# Patient Record
Sex: Female | Born: 1995 | Race: Black or African American | Hispanic: No | Marital: Single | Smoking: Never smoker
Health system: Southern US, Community
[De-identification: ages and names within clinical notes are randomized; demographics above are authoritative.]

## PROBLEM LIST (undated history)

## (undated) ENCOUNTER — Inpatient Hospital Stay (HOSPITAL_COMMUNITY): Payer: Self-pay

## (undated) DIAGNOSIS — Z789 Other specified health status: Secondary | ICD-10-CM

## (undated) DIAGNOSIS — J45909 Unspecified asthma, uncomplicated: Secondary | ICD-10-CM

## (undated) DIAGNOSIS — J302 Other seasonal allergic rhinitis: Secondary | ICD-10-CM

## (undated) DIAGNOSIS — O99019 Anemia complicating pregnancy, unspecified trimester: Secondary | ICD-10-CM

## (undated) DIAGNOSIS — O24419 Gestational diabetes mellitus in pregnancy, unspecified control: Secondary | ICD-10-CM

## (undated) HISTORY — DX: Other specified health status: Z78.9

## (undated) HISTORY — PX: KNEE SURGERY: SHX244

## (undated) HISTORY — DX: Gestational diabetes mellitus in pregnancy, unspecified control: O24.419

---
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
## 2017-10-07 ENCOUNTER — Encounter (HOSPITAL_COMMUNITY): Payer: Self-pay | Admitting: Emergency Medicine

## 2017-10-07 ENCOUNTER — Ambulatory Visit (HOSPITAL_COMMUNITY)
Admission: EM | Admit: 2017-10-07 | Discharge: 2017-10-07 | Disposition: A | Discharge status: Home / Self Care | Payer: Medicaid Other | Attending: Family Medicine | Admitting: Family Medicine

## 2017-10-07 DIAGNOSIS — J22 Unspecified acute lower respiratory infection: Secondary | ICD-10-CM

## 2017-10-07 LAB — POCT URINALYSIS DIP (DEVICE)
BILIRUBIN URINE: NEGATIVE
Glucose, UA: NEGATIVE mg/dL
HGB URINE DIPSTICK: NEGATIVE
KETONES UR: NEGATIVE mg/dL
Leukocytes, UA: NEGATIVE
Nitrite: NEGATIVE
PH: 7 (ref 5.0–8.0)
PROTEIN: NEGATIVE mg/dL
Specific Gravity, Urine: 1.02 (ref 1.005–1.030)
Urobilinogen, UA: 1 mg/dL (ref 0.0–1.0)

## 2017-10-07 LAB — POCT PREGNANCY, URINE: PREG TEST UR: NEGATIVE

## 2017-10-07 MED ORDER — AZITHROMYCIN 250 MG PO TABS: ORAL_TABLET | ORAL | 0 refills | Status: DC

## 2017-10-07 MED ORDER — ONDANSETRON HCL 4 MG PO TABS: 4.0000 mg | ORAL_TABLET | Freq: Three times a day (TID) | ORAL | 0 refills | Status: DC | PRN

## 2017-10-07 NOTE — ED Triage Notes (Signed)
PT reports "throwing up mucus and food"  PT reports cough, chest congestion, back pain, wheezing, and fatigue. Fever yesterday per PT.  PT reports symptoms have been going on for 1 week.

## 2017-10-07 NOTE — ED Provider Notes (Signed)
[facility]    CSN: [id number] Arrival date & time: 10/07/17  1210     History   Chief Complaint Chief Complaint  Patient presents with  . Emesis  . URI    HPI Crystal May is a 22 y.o. female.   [name] presents with complaints of productive cough, runny nose, sore throat, bilateral ear congestion, back pain and vomiting of mucus which started 1 week ago, she feels it has worsened. Subjective fever yesterday. States she vomits up mucus approximately 3 times a day. Decreased appetite and nausea present. Denies urinary symptoms. Denies diarrhea or constipation. Did not get a flu vaccine this season. No known ill contacts. Took sinus and allergy medication which did not help so stopped taking. Took motrin last night. Last emesis this morning. LMP 1/21, denies vaginal symptoms. Has not taken any medications today. Mild generalized abdominal pain. History of asthma, otherwise does not take any other medications regularly.    ROS per HPI.       Past Medical History:  Diagnosis Date  . Asthma   . Seasonal allergies     There are no active problems to display for this patient.   History reviewed. No pertinent surgical history.  OB History    No data available       Home Medications    Prior to Admission medications   Medication Sig Start Date End Date Taking? Authorizing Provider  albuterol (PROVENTIL HFA;VENTOLIN HFA) 108 (90 Base) MCG/ACT inhaler Inhale 1-2 puffs into the lungs every 6 (six) hours as needed for wheezing or shortness of breath. 10/22/16  Yes Servando Salinaossi, Catherine H, NP  albuterol (PROVENTIL) (2.5 MG/3ML) 0.083% nebulizer solution Take 3 mLs (2.5 mg total) by nebulization every 6 (six) hours as needed for wheezing or shortness of breath. 10/22/16  Yes Servando Salinaossi, Catherine H, NP  azithromycin (ZITHROMAX) 250 MG tablet Take first 2 tablets together, then 1 every day until finished. 10/07/17   Georgetta HaberBurky, Upton Russey B, NP  ondansetron (ZOFRAN) 4 MG tablet Take 1  tablet (4 mg total) by mouth every 8 (eight) hours as needed for nausea or vomiting. 10/07/17   Georgetta HaberBurky, Marijke Guadiana B, NP    Family History No family history on file.  Social History Social History   Tobacco Use  . Smoking status: Passive Smoke Exposure - Never Smoker  . Smokeless tobacco: Never Used  Substance Use Topics  . Alcohol use: No  . Drug use: No     Allergies   Patient has no known allergies.   Review of Systems Review of Systems   Physical Exam Triage Vital Signs ED Triage Vitals  Enc Vitals Group     BP 10/07/17 1228 117/79     Pulse Rate 10/07/17 1228 85     Resp 10/07/17 1228 18     Temp 10/07/17 1228 98.4 F (36.9 C)     Temp Source 10/07/17 1228 Oral     SpO2 10/07/17 1228 100 %     Weight 10/07/17 1230 150 lb (68 kg)     Height 10/07/17 1230 5\' 2"  (1.575 m)     Head Circumference --      Peak Flow --      Pain Score 10/07/17 1230 7     Pain Loc --      Pain Edu? --      Excl. in GC? --    No data found.  Updated Vital Signs BP 117/79 (BP Location: Left Arm)   Pulse 85  Temp 98.4 F (36.9 C) (Oral)   Resp 18   Ht 5\' 2"  (1.575 m)   Wt 150 lb (68 kg)   LMP 10/01/2017   SpO2 100%   BMI 27.44 kg/m   Visual Acuity Right Eye Distance:   Left Eye Distance:   Bilateral Distance:    Right Eye Near:   Left Eye Near:    Bilateral Near:     Physical Exam  Constitutional: She is oriented to person, place, and time. She appears well-developed and well-nourished. No distress.  HENT:  Head: Normocephalic and atraumatic.  Right Ear: Tympanic membrane, external ear and ear canal normal.  Left Ear: Tympanic membrane, external ear and ear canal normal.  Nose: Nose normal.  Mouth/Throat: Uvula is midline, oropharynx is clear and moist and mucous membranes are normal. No tonsillar exudate.  Eyes: Conjunctivae and EOM are normal. Pupils are equal, round, and reactive to light.  Cardiovascular: Normal rate, regular rhythm and normal heart sounds.    Pulmonary/Chest: Effort normal and breath sounds normal.  Occasional congested cough   Abdominal: There is generalized tenderness.  Lymphadenopathy:    She has no cervical adenopathy.  Neurological: She is alert and oriented to person, place, and time.  Skin: Skin is warm and dry.     UC Treatments / Results  Labs (all labs ordered are listed, but only abnormal results are displayed) Labs Reviewed  POCT URINALYSIS DIP (DEVICE)  POCT PREGNANCY, URINE    EKG  EKG Interpretation None       Radiology No results found.  Procedures Procedures (including critical care time)  Medications Ordered in UC Medications - No data to display   Initial Impression / Assessment and Plan / UC Course  I have reviewed the triage vital signs and the nursing notes.  Pertinent labs & imaging results that were available during my care of the patient were reviewed by me and considered in my medical decision making (see chart for details).     Non specific abdominal findings. Afebrile. Non toxic in appearance, vitals stable. Will try lower respiratory tract infection with azithromycin. zofran as needed. Fluids,  Ibuprofen etc. Return precautions provided. Patient verbalized understanding and agreeable to plan.    Final Clinical Impressions(s) / UC Diagnoses   Final diagnoses:  Lower respiratory tract infection    ED Discharge Orders        Ordered    azithromycin (ZITHROMAX) 250 MG tablet     10/07/17 1259    ondansetron (ZOFRAN) 4 MG tablet  Every 8 hours PRN     10/07/17 1259       Controlled Substance Prescriptions [place] Controlled Substance Registry consulted? Not Applicable   Georgetta Haber, NP 10/07/17 1308

## 2017-10-07 NOTE — Discharge Instructions (Signed)
Push fluids to ensure adequate hydration and keep secretions thin.  Tylenol and/or ibuprofen as needed for pain or fevers.  Complete course of antibiotics as prescribed. Diet as tolerated.  Zofran as needed for nausea. If symptoms worsen or do not improve in the next week to return to be seen or to follow up with PCP.

## 2018-02-19 ENCOUNTER — Ambulatory Visit (HOSPITAL_COMMUNITY)
Admission: EM | Admit: 2018-02-19 | Discharge: 2018-02-19 | Disposition: A | Discharge status: Home / Self Care | Payer: Medicaid Other | Attending: Family Medicine | Admitting: Family Medicine

## 2018-02-19 ENCOUNTER — Other Ambulatory Visit: Payer: Self-pay

## 2018-02-19 ENCOUNTER — Encounter (HOSPITAL_COMMUNITY): Payer: Self-pay | Admitting: Emergency Medicine

## 2018-02-19 DIAGNOSIS — J3089 Other allergic rhinitis: Secondary | ICD-10-CM | POA: Diagnosis not present

## 2018-02-19 MED ORDER — ONDANSETRON 4 MG PO TBDP: 4.0000 mg | ORAL_TABLET | Freq: Three times a day (TID) | ORAL | 0 refills | Status: DC | PRN

## 2018-02-19 MED ORDER — FLUTICASONE PROPIONATE 50 MCG/ACT NA SUSP: 2.0000 | Freq: Every day | NASAL | 0 refills | Status: DC

## 2018-02-19 MED ORDER — ALBUTEROL SULFATE HFA 108 (90 BASE) MCG/ACT IN AERS: 1.0000 | INHALATION_SPRAY | Freq: Four times a day (QID) | RESPIRATORY_TRACT | 0 refills | Status: DC | PRN

## 2018-02-19 MED ORDER — ALBUTEROL SULFATE (2.5 MG/3ML) 0.083% IN NEBU: 2.5000 mg | INHALATION_SOLUTION | Freq: Four times a day (QID) | RESPIRATORY_TRACT | 0 refills | Status: DC | PRN

## 2018-02-19 MED ORDER — CETIRIZINE HCL 10 MG PO TABS: 10.0000 mg | ORAL_TABLET | Freq: Every day | ORAL | 0 refills | Status: DC

## 2018-02-19 MED ORDER — IPRATROPIUM BROMIDE 0.06 % NA SOLN: 2.0000 | Freq: Four times a day (QID) | NASAL | 0 refills | Status: DC

## 2018-02-19 NOTE — Discharge Instructions (Signed)
Zofran as needed for nausea/vomiting. Albuterol as needed. Start flonase, atrovent nasal spray, zyrtec for nasal congestion/drainage. You can use over the counter nasal saline rinse such as neti pot for nasal congestion. Keep hydrated, your urine should be clear to pale yellow in color. Tylenol/motrin for fever and pain. Monitor for any worsening of symptoms, chest pain, shortness of breath, wheezing, swelling of the throat, follow up for reevaluation.   For sore throat try using a honey-based tea. Use 3 teaspoons of honey with juice squeezed from half lemon. Place shaved pieces of ginger into 1/2-1 cup of water and warm over stove top. Then mix the ingredients and repeat every 4 hours as needed.

## 2018-02-19 NOTE — ED Triage Notes (Signed)
Pt reports chest congestion that started one week ago.  She also complains of a scratchy throat and vomiting x2 yesterday.

## 2018-02-19 NOTE — ED Provider Notes (Signed)
[facility]    CSN: [id number] Arrival date & time: 02/19/18  1207     History   Chief Complaint Chief Complaint  Patient presents with  . URI    HPI Crystal May is a 22 y.o. female.   22 year old female comes in with mother for 1 week history of chest congestion, throat itching, rhinorrhea, nasal congestion, cough, sneezing.  States 2 days ago, started having nausea with 2 episodes of nonbilious nonbloody vomit.  Has still been able to eat and drinking without difficulty.  Denies abdominal pain,  diarrhea, constipation.  Denies fever, chills, night sweats.  NyQuil without relief.  Has not had to use her albuterol inhaler, but would like refill just in case.     Past Medical History:  Diagnosis Date  . Asthma   . Seasonal allergies     There are no active problems to display for this patient.   History reviewed. No pertinent surgical history.  OB History   None      Home Medications    Prior to Admission medications   Medication Sig Start Date End Date Taking? Authorizing Provider  albuterol (PROVENTIL HFA;VENTOLIN HFA) 108 (90 Base) MCG/ACT inhaler Inhale 1-2 puffs into the lungs every 6 (six) hours as needed for wheezing or shortness of breath. 02/19/18   Cathie Hoops, Royale Swamy V, PA-C  albuterol (PROVENTIL) (2.5 MG/3ML) 0.083% nebulizer solution Take 3 mLs (2.5 mg total) by nebulization every 6 (six) hours as needed for wheezing or shortness of breath. 02/19/18   Cathie Hoops, Tyra Gural V, PA-C  cetirizine (ZYRTEC) 10 MG tablet Take 1 tablet (10 mg total) by mouth daily. 02/19/18   Cathie Hoops, Chelsey Redondo V, PA-C  fluticasone (FLONASE) 50 MCG/ACT nasal spray Place 2 sprays into both nostrils daily. 02/19/18   Cathie Hoops, Kathlee Barnhardt V, PA-C  ipratropium (ATROVENT) 0.06 % nasal spray Place 2 sprays into both nostrils 4 (four) times daily. 02/19/18   Cathie Hoops, Kiesha Ensey V, PA-C  ondansetron (ZOFRAN ODT) 4 MG disintegrating tablet Take 1 tablet (4 mg total) by mouth every 8 (eight) hours as needed for nausea or vomiting. 02/19/18    Belinda Fisher, PA-C    Family History History reviewed. No pertinent family history.  Social History Social History   Tobacco Use  . Smoking status: Passive Smoke Exposure - Never Smoker  . Smokeless tobacco: Never Used  Substance Use Topics  . Alcohol use: No  . Drug use: No     Allergies   Patient has no known allergies.   Review of Systems Review of Systems  Reason unable to perform ROS: See HPI as above.     Physical Exam Triage Vital Signs ED Triage Vitals [02/19/18 1301]  Enc Vitals Group     BP 126/77     Pulse Rate 74     Resp      Temp 98.2 F (36.8 C)     Temp Source Oral     SpO2 100 %     Weight      Height      Head Circumference      Peak Flow      Pain Score 7     Pain Loc      Pain Edu?      Excl. in GC?    No data found.  Updated Vital Signs BP 126/77 (BP Location: Left Arm)   Pulse 74   Temp 98.2 F (36.8 C) (Oral)   LMP 02/02/2018 (Exact Date)   SpO2 100%  Physical Exam  Constitutional: She is oriented to person, place, and time. She appears well-developed and well-nourished. No distress.  HENT:  Head: Normocephalic and atraumatic.  Right Ear: Tympanic membrane, external ear and ear canal normal. Tympanic membrane is not erythematous and not bulging.  Left Ear: Tympanic membrane, external ear and ear canal normal. Tympanic membrane is not erythematous and not bulging.  Nose: Nose normal. Right sinus exhibits no maxillary sinus tenderness and no frontal sinus tenderness. Left sinus exhibits no maxillary sinus tenderness and no frontal sinus tenderness.  Mouth/Throat: Uvula is midline, oropharynx is clear and moist and mucous membranes are normal.  Eyes: Pupils are equal, round, and reactive to light. Conjunctivae are normal.  Neck: Normal range of motion. Neck supple.  Cardiovascular: Normal rate, regular rhythm and normal heart sounds. Exam reveals no gallop and no friction rub.  No murmur heard. Pulmonary/Chest: Effort normal and  breath sounds normal. She has no decreased breath sounds. She has no wheezes. She has no rhonchi. She has no rales.  Abdominal: Soft. Bowel sounds are normal. There is no tenderness. There is no rebound and no guarding.  Lymphadenopathy:    She has no cervical adenopathy.  Neurological: She is alert and oriented to person, place, and time.  Skin: Skin is warm and dry.  Psychiatric: She has a normal mood and affect. Her behavior is normal. Judgment normal.     UC Treatments / Results  Labs (all labs ordered are listed, but only abnormal results are displayed) Labs Reviewed - No data to display  EKG None  Radiology No results found.  Procedures Procedures (including critical care time)  Medications Ordered in UC Medications - No data to display  Initial Impression / Assessment and Plan / UC Course  I have reviewed the triage vital signs and the nursing notes.  Pertinent labs & imaging results that were available during my care of the patient were reviewed by me and considered in my medical decision making (see chart for details).     Given history and exam, will treat for allergic rhinitis.  Given 2-day history of nausea, vomiting, will provide Zofran as needed.  Albuterol refilled.  Other symptomatic treatment discussed.  Return precautions given.  Patient expresses understanding and agrees to plan.  Final Clinical Impressions(s) / UC Diagnoses   Final diagnoses:  Non-seasonal allergic rhinitis, unspecified trigger    ED Prescriptions    Medication Sig Dispense Auth. Provider   albuterol (PROVENTIL HFA;VENTOLIN HFA) 108 (90 Base) MCG/ACT inhaler Inhale 1-2 puffs into the lungs every 6 (six) hours as needed for wheezing or shortness of breath. 1 Inhaler Jarrah Seher V, PA-C   albuterol (PROVENTIL) (2.5 MG/3ML) 0.083% nebulizer solution Take 3 mLs (2.5 mg total) by nebulization every 6 (six) hours as needed for wheezing or shortness of breath. 75 mL Xian Alves V, PA-C   cetirizine  (ZYRTEC) 10 MG tablet Take 1 tablet (10 mg total) by mouth daily. 15 tablet Treva Huyett V, PA-C   fluticasone (FLONASE) 50 MCG/ACT nasal spray Place 2 sprays into both nostrils daily. 1 g Skiler Tye V, PA-C   ipratropium (ATROVENT) 0.06 % nasal spray Place 2 sprays into both nostrils 4 (four) times daily. 15 mL Gennie Dib V, PA-C   ondansetron (ZOFRAN ODT) 4 MG disintegrating tablet Take 1 tablet (4 mg total) by mouth every 8 (eight) hours as needed for nausea or vomiting. 5 tablet Belinda FisherYu, Maurine Mowbray V, PA-C        Kaylla Cobos V,  PA-C 02/19/18 1336

## 2018-02-23 ENCOUNTER — Ambulatory Visit (INDEPENDENT_AMBULATORY_CARE_PROVIDER_SITE_OTHER): Payer: Medicaid Other

## 2018-02-23 ENCOUNTER — Encounter (HOSPITAL_COMMUNITY): Payer: Self-pay

## 2018-02-23 ENCOUNTER — Ambulatory Visit (HOSPITAL_COMMUNITY)
Admission: EM | Admit: 2018-02-23 | Discharge: 2018-02-23 | Disposition: A | Discharge status: Home / Self Care | Payer: Medicaid Other | Attending: Family Medicine | Admitting: Family Medicine

## 2018-02-23 DIAGNOSIS — M25561 Pain in right knee: Secondary | ICD-10-CM | POA: Diagnosis not present

## 2018-02-23 MED ORDER — GABAPENTIN 300 MG PO CAPS: 300.0000 mg | ORAL_CAPSULE | Freq: Two times a day (BID) | ORAL | 0 refills | Status: DC

## 2018-02-23 NOTE — ED Triage Notes (Signed)
Pt presents with right knee pain and leg tingling. Reports surgery to that knee two years ago.

## 2018-02-23 NOTE — ED Provider Notes (Signed)
[facility]    CSN: [id number] Arrival date & time: 02/23/18  1336     History   Chief Complaint Chief Complaint  Patient presents with  . Knee Pain    HPI Crystal May is a 22 y.o. female.   The history is provided by the patient.  Knee Pain  Location:  Knee Time since incident:  3 days Injury: yes (Surgery on the knee 2 yrs ago for meniscial repair)   Knee location:  R knee Pain details:    Quality:  Aching, cramping and tingling (Feels like the pin inside is moving when she walks)   Radiates to: right foot.   Pain severity now: 6/10  in severity.   Onset quality:  Gradual   Duration:  2 days   Timing:  Constant   Progression:  Worsening Chronicity:  New (New since she had the surgery) Foreign body present: FB, metal/ nail present s/p surgery. Prior injury to area: Meniscial tear. Relieved by:  Nothing Worsened by:  Bearing weight Ineffective treatments:  NSAIDs (Advil didn't work) Associated symptoms: swelling and tingling   Associated symptoms: no back pain, no decreased ROM, no fever, no muscle weakness and no stiffness   Associated symptoms comment:  Numbness in the back of her leg on the calf. Mom stated during surgery 2 yrs ago, they cut the nerve in the front and left the one on her calf. Unclear what this means.Numbness has been since surgery.   Past Medical History:  Diagnosis Date  . Asthma   . Seasonal allergies     There are no active problems to display for this patient.   Past Surgical History:  Procedure Laterality Date  . KNEE SURGERY      OB History   None      Home Medications    Prior to Admission medications   Medication Sig Start Date End Date Taking? Authorizing Provider  albuterol (PROVENTIL HFA;VENTOLIN HFA) 108 (90 Base) MCG/ACT inhaler Inhale 1-2 puffs into the lungs every 6 (six) hours as needed for wheezing or shortness of breath. 02/19/18   Cathie HoopsYu, Amy V, PA-C  albuterol (PROVENTIL) (2.5 MG/3ML) 0.083%  nebulizer solution Take 3 mLs (2.5 mg total) by nebulization every 6 (six) hours as needed for wheezing or shortness of breath. 02/19/18   Cathie HoopsYu, Amy V, PA-C  cetirizine (ZYRTEC) 10 MG tablet Take 1 tablet (10 mg total) by mouth daily. 02/19/18   Cathie HoopsYu, Amy V, PA-C  fluticasone (FLONASE) 50 MCG/ACT nasal spray Place 2 sprays into both nostrils daily. 02/19/18   Cathie HoopsYu, Amy V, PA-C  ipratropium (ATROVENT) 0.06 % nasal spray Place 2 sprays into both nostrils 4 (four) times daily. 02/19/18   Cathie HoopsYu, Amy V, PA-C  ondansetron (ZOFRAN ODT) 4 MG disintegrating tablet Take 1 tablet (4 mg total) by mouth every 8 (eight) hours as needed for nausea or vomiting. 02/19/18   Belinda FisherYu, Amy V, PA-C    Family History Family History  Problem Relation Age of Onset  . Healthy Mother   . Healthy Father     Social History Social History   Tobacco Use  . Smoking status: Passive Smoke Exposure - Never Smoker  . Smokeless tobacco: Never Used  Substance Use Topics  . Alcohol use: No  . Drug use: No     Allergies   Patient has no known allergies.   Review of Systems Review of Systems  Constitutional: Negative for fever.  Respiratory: Negative.   Cardiovascular: Negative.   Gastrointestinal:  Negative.   Musculoskeletal: Positive for joint swelling. Negative for back pain and stiffness.       Right knee pain  Neurological: Positive for numbness.  All other systems reviewed and are negative.    Physical Exam Triage Vital Signs ED Triage Vitals [02/23/18 1420]  Enc Vitals Group     BP 126/73     Pulse Rate 85     Resp 18     Temp 98.4 F (36.9 C)     Temp src      SpO2 95 %     Weight      Height      Head Circumference      Peak Flow      Pain Score 6     Pain Loc      Pain Edu?      Excl. in GC?    No data found.  Updated Vital Signs BP 126/73   Pulse 85   Temp 98.4 F (36.9 C)   Resp 18   LMP 02/02/2018 (Exact Date)   SpO2 95%   Visual Acuity Right Eye Distance:   Left Eye Distance:     Bilateral Distance:    Right Eye Near:   Left Eye Near:    Bilateral Near:     Physical Exam  Constitutional: She appears well-developed. No distress.  Cardiovascular: Normal rate, regular rhythm and normal heart sounds.  No murmur heard. Pulmonary/Chest: Effort normal and breath sounds normal. No respiratory distress. She has no wheezes.  Musculoskeletal:       Right knee: She exhibits decreased range of motion. She exhibits no swelling, no effusion, no deformity, no erythema and no LCL laxity. Tenderness found.       Left knee: Normal.       Legs: Nursing note and vitals reviewed.    UC Treatments / Results  Labs (all labs ordered are listed, but only abnormal results are displayed) Labs Reviewed - No data to display  EKG None  Radiology No results found.  Procedures Procedures (including critical care time)  Medications Ordered in UC Medications - No data to display  Initial Impression / Assessment and Plan / UC Course  I have reviewed the triage vital signs and the nursing notes.  Pertinent labs & imaging results that were available during my care of the patient were reviewed by me and considered in my medical decision making (see chart for details).  Clinical Course as of Feb 23 1529  Sat Feb 23, 2018  1528 Xray report reviewed and discussed with patient. No acute findings. Orthopedic f/u recommended. Gabapentin for tingling and pain. Mom preferred Gabapentin for her over Naproxen. May use Advil for breakthrough pain. F/U as needed.   [KE]    Clinical Course User Index [KE] Doreene Eland, MD    Acute pain of right knee   Final Clinical Impressions(s) / UC Diagnoses   Final diagnoses:  None   Discharge Instructions   None    ED Prescriptions    None     Controlled Substance Prescriptions [place] Controlled Substance Registry consulted? Not Applicable   Doreene Eland, MD 02/23/18 1530

## 2018-02-23 NOTE — Discharge Instructions (Addendum)
It was nice seeing you. Sorry about your knee pain. Xray result is below. No acute finding. Please call to schedule follow-up with orthopedic. Gabapentin for nerve pain. Use Advil as needed for breakthrough pain. Rest at home for today. Follow-up as needed.     IMPRESSION: 1. No acute fracture, dislocation, or joint effusion. Joint spaces are maintained. 2. Postsurgical changes as above.

## 2018-03-10 ENCOUNTER — Other Ambulatory Visit: Payer: Self-pay

## 2018-03-10 ENCOUNTER — Ambulatory Visit (HOSPITAL_COMMUNITY)
Admission: EM | Admit: 2018-03-10 | Discharge: 2018-03-10 | Disposition: A | Discharge status: Home / Self Care | Payer: Medicaid Other | Attending: Family Medicine | Admitting: Family Medicine

## 2018-03-10 DIAGNOSIS — R110 Nausea: Secondary | ICD-10-CM | POA: Diagnosis not present

## 2018-03-10 DIAGNOSIS — Z3201 Encounter for pregnancy test, result positive: Secondary | ICD-10-CM

## 2018-03-10 LAB — POCT PREGNANCY, URINE: PREG TEST UR: POSITIVE — AB

## 2018-03-10 MED ORDER — DOXYLAMINE-PYRIDOXINE 10-10 MG PO TBEC: 10.0000 mg | DELAYED_RELEASE_TABLET | Freq: Every day | ORAL | 0 refills | Status: DC

## 2018-03-10 NOTE — Discharge Instructions (Signed)
Please begin taking prenatal vitamin Please follow-up with [facility] clinic For nausea: Diclegis: Two tablets at bedtime on day 1 and 2; if symptoms persist, take 1 tablet in morning and 2 tablets at bedtime on day 3; if symptoms persist, may increase to 1 tablet in morning, 1 tablet mid-afternoon, and 2 tablets at bedtime on day 4 (maximum: doxylamine 40 mg/pyridoxine 40 mg (4 tablets) per day). OR One-half of the 25 mg Unisom sleep tablet over-the-counter tablet or two chewable 5 mg tablets can be used off-label as an antiemetic. In addition, pyridoxine 25 mg, also available over-the-counter, is taken three or four times per day;This is a reasonable, less expensive substitute for combination tablets.

## 2018-03-10 NOTE — ED Provider Notes (Signed)
[facility]    CSN: [id number] Arrival date & time: 03/10/18  1202     History   Chief Complaint Chief Complaint  Patient presents with  . Possible Pregnancy    HPI Crystal May is a 22 y.o. female history of asthma presenting today for a pregnancy test.  Patient states that she took 2 pregnancy test at home yesterday that were positive.  She states that her last menstrual period was 5/22 to 5/25.  She has had increased nausea, but denies vomiting.  Patient has not been pregnant previously.  HPI  Past Medical History:  Diagnosis Date  . Asthma   . Seasonal allergies     There are no active problems to display for this patient.   Past Surgical History:  Procedure Laterality Date  . KNEE SURGERY      OB History   None      Home Medications    Prior to Admission medications   Medication Sig Start Date End Date Taking? Authorizing Provider  albuterol (PROVENTIL HFA;VENTOLIN HFA) 108 (90 Base) MCG/ACT inhaler Inhale 1-2 puffs into the lungs every 6 (six) hours as needed for wheezing or shortness of breath. 02/19/18   Cathie Hoops, Amy V, PA-C  albuterol (PROVENTIL) (2.5 MG/3ML) 0.083% nebulizer solution Take 3 mLs (2.5 mg total) by nebulization every 6 (six) hours as needed for wheezing or shortness of breath. 02/19/18   Cathie Hoops, Amy V, PA-C  cetirizine (ZYRTEC) 10 MG tablet Take 1 tablet (10 mg total) by mouth daily. 02/19/18   Cathie Hoops, Amy V, PA-C  Doxylamine-Pyridoxine 10-10 MG TBEC Take 10 mg by mouth daily. 03/10/18   Hagen Tidd C, PA-C  fluticasone (FLONASE) 50 MCG/ACT nasal spray Place 2 sprays into both nostrils daily. 02/19/18   Cathie Hoops, Amy V, PA-C  gabapentin (NEURONTIN) 300 MG capsule Take 1 capsule (300 mg total) by mouth 2 (two) times daily for 15 days. 02/23/18 03/10/18  Doreene Eland, MD  ipratropium (ATROVENT) 0.06 % nasal spray Place 2 sprays into both nostrils 4 (four) times daily. 02/19/18   Cathie Hoops, Amy V, PA-C  ondansetron (ZOFRAN ODT) 4 MG disintegrating tablet  Take 1 tablet (4 mg total) by mouth every 8 (eight) hours as needed for nausea or vomiting. 02/19/18   Belinda Fisher, PA-C    Family History Family History  Problem Relation Age of Onset  . Healthy Mother   . Healthy Father     Social History Social History   Tobacco Use  . Smoking status: Passive Smoke Exposure - Never Smoker  . Smokeless tobacco: Never Used  Substance Use Topics  . Alcohol use: No  . Drug use: No     Allergies   Patient has no known allergies.   Review of Systems Review of Systems  Constitutional: Negative for fatigue and fever.  Respiratory: Negative for shortness of breath.   Cardiovascular: Negative for chest pain.  Gastrointestinal: Positive for diarrhea and nausea. Negative for abdominal pain, blood in stool and vomiting.  Genitourinary: Negative for dysuria, frequency, genital sores, pelvic pain, vaginal bleeding, vaginal discharge and vaginal pain.  Neurological: Negative for dizziness, light-headedness and headaches.     Physical Exam Triage Vital Signs ED Triage Vitals  Enc Vitals Group     BP 03/10/18 1231 117/72     Pulse Rate 03/10/18 1231 96     Resp --      Temp 03/10/18 1231 98.5 F (36.9 C)     Temp Source 03/10/18 1231 Oral  SpO2 03/10/18 1231 100 %     Weight --      Height --      Head Circumference --      Peak Flow --      Pain Score 03/10/18 1230 0     Pain Loc --      Pain Edu? --      Excl. in GC? --    No data found.  Updated Vital Signs BP 117/72 (BP Location: Left Arm)   Pulse 96   Temp 98.5 F (36.9 C) (Oral)   LMP 01/30/2018 (Exact Date)   SpO2 100%   Visual Acuity Right Eye Distance:   Left Eye Distance:   Bilateral Distance:    Right Eye Near:   Left Eye Near:    Bilateral Near:     Physical Exam  Constitutional: She is oriented to person, place, and time. She appears well-developed and well-nourished.  No acute distress  HENT:  Head: Normocephalic and atraumatic.  Nose: Nose normal.    Eyes: Conjunctivae are normal.  Neck: Neck supple.  Cardiovascular: Normal rate.  Pulmonary/Chest: Effort normal. No respiratory distress.  Abdominal: Soft. She exhibits no distension. There is no tenderness.  Musculoskeletal: Normal range of motion.  Neurological: She is alert and oriented to person, place, and time.  Skin: Skin is warm and dry.  Psychiatric: She has a normal mood and affect.  Nursing note and vitals reviewed.    UC Treatments / Results  Labs (all labs ordered are listed, but only abnormal results are displayed) Labs Reviewed  POCT PREGNANCY, URINE - Abnormal; Notable for the following components:      Result Value   Preg Test, Ur POSITIVE (*)    All other components within normal limits    EKG None  Radiology No results found.  Procedures Procedures (including critical care time)  Medications Ordered in UC Medications - No data to display  Initial Impression / Assessment and Plan / UC Course  I have reviewed the triage vital signs and the nursing notes.  Pertinent labs & imaging results that were available during my care of the patient were reviewed by me and considered in my medical decision making (see chart for details).     Pregnancy test positive.  Will have patient follow-up with women's clinic.  Initiate prenatal vitamin.  Provided information on prenatal care.  Discussed using Diclegis for nausea, provided over-the-counter instructions as well.Discussed strict return precautions. Patient verbalized understanding and is agreeable with plan.  Final Clinical Impressions(s) / UC Diagnoses   Final diagnoses:  Positive pregnancy test     Discharge Instructions     Please begin taking prenatal vitamin Please follow-up with women's clinic For nausea: Diclegis: Two tablets at bedtime on day 1 and 2; if symptoms persist, take 1 tablet in morning and 2 tablets at bedtime on day 3; if symptoms persist, may increase to 1 tablet in morning, 1  tablet mid-afternoon, and 2 tablets at bedtime on day 4 (maximum: doxylamine 40 mg/pyridoxine 40 mg (4 tablets) per day). OR One-half of the 25 mg Unisom sleep tablet over-the-counter tablet or two chewable 5 mg tablets can be used off-label as an antiemetic. In addition, pyridoxine 25 mg, also available over-the-counter, is taken three or four times per day;This is a reasonable, less expensive substitute for combination tablets.    ED Prescriptions    Medication Sig Dispense Auth. Provider   Doxylamine-Pyridoxine 10-10 MG TBEC Take 10 mg by mouth daily. 60  tablet Lido Maske, DaleHallie C, PA-C     Controlled Substance Prescriptions [place] Controlled Substance Registry consulted? Not Applicable   Lew DawesWieters, Cross Jorge C, [place]PA-C 03/10/18 1324

## 2018-03-10 NOTE — ED Triage Notes (Signed)
Took pregnancy test and it was positive at home and would like to get confirmation

## 2018-04-13 ENCOUNTER — Encounter (HOSPITAL_COMMUNITY): Payer: Self-pay | Admitting: Emergency Medicine

## 2018-04-13 ENCOUNTER — Emergency Department (HOSPITAL_COMMUNITY): Payer: Self-pay

## 2018-04-13 ENCOUNTER — Other Ambulatory Visit: Payer: Self-pay

## 2018-04-13 ENCOUNTER — Emergency Department (HOSPITAL_COMMUNITY)
Admission: EM | Admit: 2018-04-13 | Discharge: 2018-04-13 | Disposition: A | Discharge status: Home / Self Care | Payer: Self-pay | Attending: Emergency Medicine | Admitting: Emergency Medicine

## 2018-04-13 DIAGNOSIS — J45909 Unspecified asthma, uncomplicated: Secondary | ICD-10-CM | POA: Insufficient documentation

## 2018-04-13 DIAGNOSIS — O200 Threatened abortion: Secondary | ICD-10-CM | POA: Insufficient documentation

## 2018-04-13 DIAGNOSIS — Z3A01 Less than 8 weeks gestation of pregnancy: Secondary | ICD-10-CM | POA: Insufficient documentation

## 2018-04-13 DIAGNOSIS — Z79899 Other long term (current) drug therapy: Secondary | ICD-10-CM | POA: Insufficient documentation

## 2018-04-13 DIAGNOSIS — Z7722 Contact with and (suspected) exposure to environmental tobacco smoke (acute) (chronic): Secondary | ICD-10-CM | POA: Insufficient documentation

## 2018-04-13 LAB — COMPREHENSIVE METABOLIC PANEL
ALBUMIN: 3.7 g/dL (ref 3.5–5.0)
ALT: 15 U/L (ref 0–44)
AST: 16 U/L (ref 15–41)
Alkaline Phosphatase: 49 U/L (ref 38–126)
Anion gap: 8 (ref 5–15)
BUN: 9 mg/dL (ref 6–20)
CALCIUM: 9.4 mg/dL (ref 8.9–10.3)
CHLORIDE: 106 mmol/L (ref 98–111)
CO2: 22 mmol/L (ref 22–32)
CREATININE: 0.59 mg/dL (ref 0.44–1.00)
GFR calc Af Amer: >60 mL/min (ref >60)
Glucose, Bld: 107 mg/dL — ABNORMAL HIGH (ref 70–99)
Potassium: 3.9 mmol/L (ref 3.5–5.1)
SODIUM: 136 mmol/L (ref 135–145)
TOTAL PROTEIN: 6.9 g/dL (ref 6.5–8.1)
Total Bilirubin: 0.3 mg/dL (ref 0.3–1.2)

## 2018-04-13 LAB — URINALYSIS, ROUTINE W REFLEX MICROSCOPIC
BILIRUBIN URINE: NEGATIVE
Glucose, UA: NEGATIVE mg/dL
Ketones, ur: NEGATIVE mg/dL
Leukocytes, UA: NEGATIVE
NITRITE: NEGATIVE
Protein, ur: NEGATIVE mg/dL
SPECIFIC GRAVITY, URINE: 1.015 (ref 1.005–1.030)
pH: 7 (ref 5.0–8.0)

## 2018-04-13 LAB — CBC WITH DIFFERENTIAL/PLATELET
ABS IMMATURE GRANULOCYTES: 0 10*3/uL (ref 0.0–0.1)
BASOS ABS: 0 10*3/uL (ref 0.0–0.1)
BASOS PCT: 0 %
EOS ABS: 0.3 10*3/uL (ref 0.0–0.7)
Eosinophils Relative: 3 %
HCT: 37.9 % (ref 36.0–46.0)
Hemoglobin: 12 g/dL (ref 12.0–15.0)
Immature Granulocytes: 0 %
Lymphocytes Relative: 36 %
Lymphs Abs: 3.1 10*3/uL (ref 0.7–4.0)
MCH: 25.4 pg — ABNORMAL LOW (ref 26.0–34.0)
MCHC: 31.7 g/dL (ref 30.0–36.0)
MCV: 80.3 fL (ref 78.0–100.0)
MONOS PCT: 5 %
Monocytes Absolute: 0.4 10*3/uL (ref 0.1–1.0)
NEUTROS ABS: 4.8 10*3/uL (ref 1.7–7.7)
NEUTROS PCT: 56 %
Platelets: 253 10*3/uL (ref 150–400)
RBC: 4.72 MIL/uL (ref 3.87–5.11)
RDW: 14.1 % (ref 11.5–15.5)
WBC: 8.6 10*3/uL (ref 4.0–10.5)

## 2018-04-13 LAB — HCG, QUANTITATIVE, PREGNANCY: hCG, Beta Chain, Quant, S: 29878 m[IU]/mL — ABNORMAL HIGH (ref <5)

## 2018-04-13 LAB — WET PREP, GENITAL
SPERM: NONE SEEN
TRICH WET PREP: NONE SEEN
WBC, Wet Prep HPF POC: NONE SEEN
YEAST WET PREP: NONE SEEN

## 2018-04-13 LAB — ABO/RH
ABO/RH(D): O NEG
Antibody Screen: NEGATIVE

## 2018-04-13 NOTE — Discharge Instructions (Addendum)
Your exam today is concerning for possible miscarriage. [facility]  [place] Ob Comp < 14 Wks  Result Date: 04/13/2018 CLINICAL DATA:  Pregnant patient in first-trimester pregnancy with vaginal bleeding. EXAM: OBSTETRIC <14 WK US AND TRANSVAGINAL OB US TECHNIQUE: Both transabdominal and transvaginal ultrasound examinations were performed for complete evaluation of the gestation as well as the maternal uterus, adnexal regions, and pelvic cul-de-sac. Transvaginal technique was performed to assess early pregnancy. COMPARISON:  None. FINDINGS: Intrauterine gestational sac: Single Yolk sac:  Visualized. Embryo:  Visualized. Cardiac Activity: Not Visualized. Heart Rate: Not applicable. CRL:  3.4 mm   6 w   0 d                  US EDC: 12/07/2018 Subchorionic hemorrhage:  None visualized. Maternal uterus/adnexae: Both ovaries are visualized and are normal. No adnexal mass. No pelvic free fluid. IMPRESSION: Intrauterine gestational sac containing a yolk sac and fetal pole but no cardiac activity demonstrated. Findings are suspicious but not yet definitive for failed pregnancy. Recommend follow-up US in 10-14 days for definitive diagnosis. This recommendation follows SRU consensus guidelines: Diagnostic Criteria for Nonviable Pregnancy Early in the First Trimester. Malva Limes Engl J Med 2013; 696:2952-84; 369:1443-51. Electronically Signed   By: Rubye OaksMelanie  Ehinger M.D.   On: 04/13/2018 06:10   [place] Ob Transvaginal  Result Date: 04/13/2018 CLINICAL DATA:  Pregnant patient in first-trimester pregnancy with vaginal bleeding. EXAM: OBSTETRIC <14 WK US AND TRANSVAGINAL OB US TECHNIQUE: Both transabdominal and transvaginal ultrasound examinations were performed for complete evaluation of the gestation as well as the maternal uterus, adnexal regions, and pelvic cul-de-sac. Transvaginal technique was performed to assess early pregnancy. COMPARISON:  None.  FINDINGS: Intrauterine gestational sac: Single Yolk sac:  Visualized. Embryo:  Visualized. Cardiac Activity: Not Visualized. Heart Rate: Not applicable. CRL:  3.4 mm   6 w   0 d                  US EDC: 12/07/2018 Subchorionic hemorrhage:  None visualized. Maternal uterus/adnexae: Both ovaries are visualized and are normal. No adnexal mass. No pelvic free fluid. IMPRESSION: Intrauterine gestational sac containing a yolk sac and fetal pole but no cardiac activity demonstrated. Findings are suspicious but not yet definitive for failed pregnancy. Recommend follow-up US in 10-14 days for definitive diagnosis. This recommendation follows SRU consensus guidelines: Diagnostic Criteria for Nonviable Pregnancy Early in the First Trimester. Malva Limes Engl J Med 2013; 132:4401-02; 369:1443-51. Electronically Signed   By: Rubye OaksMelanie  Ehinger M.D.   On: 04/13/2018 06:10

## 2018-04-13 NOTE — ED Provider Notes (Signed)
[facility] EMERGENCY DEPARTMENT Provider Note   CSN: [id number] Arrival date & time: 04/13/18  0401     History   Chief Complaint Chief Complaint  Patient presents with  . Vaginal Bleeding    pregnant    HPI Crystal May is a 21 y.o. female.  HPI  22 year old female G1, P0 LMP 518 presents today stating she has had some vaginal bleeding and cramping during the night last night.  She describes 2 episodes of dark-colored blood on the toilet tissue.  She did not have any ongoing abdominal pain.  She is currently not having pain or bleeding.  She denies any abnormal vaginal discharge.  She states that this is her first pregnancy.  She feels that she has been having some signs and symptoms consistent with pregnancy.  She had a positive pregnancy test at home in June and then was seen in urgent care and states she had positive patency test at that time.  Past Medical History:  Diagnosis Date  . Asthma   . Seasonal allergies     There are no active problems to display for this patient.   Past Surgical History:  Procedure Laterality Date  . KNEE SURGERY       OB History    Gravida  1   Para      Term      Preterm      AB      Living        SAB      TAB      Ectopic      Multiple      Live Births               Home Medications    Prior to Admission medications   Medication Sig Start Date End Date Taking? Authorizing Provider  albuterol (PROVENTIL HFA;VENTOLIN HFA) 108 (90 Base) MCG/ACT inhaler Inhale 1-2 puffs into the lungs every 6 (six) hours as needed for wheezing or shortness of breath. 02/19/18   Cathie Hoops, Amy V, PA-C  albuterol (PROVENTIL) (2.5 MG/3ML) 0.083% nebulizer solution Take 3 mLs (2.5 mg total) by nebulization every 6 (six) hours as needed for wheezing or shortness of breath. 02/19/18   Cathie Hoops, Amy V, PA-C  cetirizine (ZYRTEC) 10 MG tablet Take 1 tablet (10 mg total) by mouth daily. 02/19/18   Cathie Hoops, Amy V, PA-C    Doxylamine-Pyridoxine 10-10 MG TBEC Take 10 mg by mouth daily. 03/10/18   Wieters, Hallie C, PA-C  fluticasone (FLONASE) 50 MCG/ACT nasal spray Place 2 sprays into both nostrils daily. 02/19/18   Cathie Hoops, Amy V, PA-C  gabapentin (NEURONTIN) 300 MG capsule Take 1 capsule (300 mg total) by mouth 2 (two) times daily for 15 days. 02/23/18 03/10/18  Doreene Eland, MD  ipratropium (ATROVENT) 0.06 % nasal spray Place 2 sprays into both nostrils 4 (four) times daily. 02/19/18   Cathie Hoops, Amy V, PA-C  ondansetron (ZOFRAN ODT) 4 MG disintegrating tablet Take 1 tablet (4 mg total) by mouth every 8 (eight) hours as needed for nausea or vomiting. 02/19/18   Belinda Fisher, PA-C    Family History Family History  Problem Relation Age of Onset  . Healthy Mother   . Healthy Father     Social History Social History   Tobacco Use  . Smoking status: Passive Smoke Exposure - Never Smoker  . Smokeless tobacco: Never Used  Substance Use Topics  . Alcohol use: No  . Drug use: No  Allergies   Patient has no known allergies.   Review of Systems Review of Systems  All other systems reviewed and are negative.    Physical Exam Updated Vital Signs BP 113/74 (BP Location: Right Arm)   Pulse 87   Temp 98.9 F (37.2 C) (Oral)   Resp 12   Ht 1.575 m (5\' 2" )   Wt 72.6 kg (160 lb)   LMP 01/30/2018   SpO2 99%   BMI 29.26 kg/m   Physical Exam  Constitutional: She is oriented to person, place, and time. She appears well-developed and well-nourished.  HENT:  Head: Normocephalic and atraumatic.  Right Ear: External ear normal.  Left Ear: External ear normal.  Nose: Nose normal.  Mouth/Throat: Oropharynx is clear and moist.  Eyes: Pupils are equal, round, and reactive to light. Conjunctivae and EOM are normal.  Neck: Normal range of motion. Neck supple.  Cardiovascular: Normal rate, regular rhythm, normal heart sounds and intact distal pulses.  Pulmonary/Chest: Effort normal and breath sounds normal.   Abdominal: Soft. Bowel sounds are normal.  Genitourinary: Vagina normal.  Genitourinary Comments: Uterus palpable and slightly enlarged  Musculoskeletal: Normal range of motion.  Neurological: She is alert and oriented to person, place, and time. She has normal reflexes.  Skin: Skin is warm and dry.  Psychiatric: She has a normal mood and affect. Her behavior is normal. Judgment and thought content normal.  Nursing note and vitals reviewed.    ED Treatments / Results  Labs (all labs ordered are listed, but only abnormal results are displayed) Labs Reviewed  CBC WITH DIFFERENTIAL/PLATELET - Abnormal; Notable for the following components:      Result Value   MCH 25.4 (*)    All other components within normal limits  COMPREHENSIVE METABOLIC PANEL - Abnormal; Notable for the following components:   Glucose, Bld 107 (*)    All other components within normal limits  HCG, QUANTITATIVE, PREGNANCY - Abnormal; Notable for the following components:   hCG, Beta Chain, Quant, S 29,878 (*)    All other components within normal limits  URINALYSIS, ROUTINE W REFLEX MICROSCOPIC - Abnormal; Notable for the following components:   Color, Urine STRAW (*)    Hgb urine dipstick MODERATE (*)    Bacteria, UA RARE (*)    All other components within normal limits  WET PREP, GENITAL  ABO/RH  GC/CHLAMYDIA PROBE AMP ([place]) NOT AT [facility]    EKG None  Radiology [place] Ob Comp < 14 Wks  Result Date: 04/13/2018 CLINICAL DATA:  Pregnant patient in first-trimester pregnancy with vaginal bleeding. EXAM: OBSTETRIC <14 WK US AND TRANSVAGINAL OB US TECHNIQUE: Both transabdominal and transvaginal ultrasound examinations were performed for complete evaluation of the gestation as well as the maternal uterus, adnexal regions, and pelvic cul-de-sac. Transvaginal technique was performed to assess early pregnancy. COMPARISON:  None. FINDINGS: Intrauterine gestational sac: Single Yolk sac:  Visualized. Embryo:   Visualized. Cardiac Activity: Not Visualized. Heart Rate: Not applicable. CRL:  3.4 mm   6 w   0 d                  US EDC: 12/07/2018 Subchorionic hemorrhage:  None visualized. Maternal uterus/adnexae: Both ovaries are visualized and are normal. No adnexal mass. No pelvic free fluid. IMPRESSION: Intrauterine gestational sac containing a yolk sac and fetal pole but no cardiac activity demonstrated. Findings are suspicious but not yet definitive for failed pregnancy. Recommend follow-up US in 10-14 days for definitive diagnosis. This recommendation follows  SRU consensus guidelines: Diagnostic Criteria for Nonviable Pregnancy Early in the First Trimester. Malva Limes Med 2013; 161:0960-45. Electronically Signed   By: Rubye Oaks M.D.   On: 04/13/2018 06:10   US Ob Transvaginal  Result Date: 04/13/2018 CLINICAL DATA:  Pregnant patient in first-trimester pregnancy with vaginal bleeding. EXAM: OBSTETRIC <14 WK [place] AND TRANSVAGINAL OB US TECHNIQUE: Both transabdominal and transvaginal ultrasound examinations were performed for complete evaluation of the gestation as well as the maternal uterus, adnexal regions, and pelvic cul-de-sac. Transvaginal technique was performed to assess early pregnancy. COMPARISON:  None. FINDINGS: Intrauterine gestational sac: Single Yolk sac:  Visualized. Embryo:  Visualized. Cardiac Activity: Not Visualized. Heart Rate: Not applicable. CRL:  3.4 mm   6 w   0 d                  [place] EDC: 12/07/2018 Subchorionic hemorrhage:  None visualized. Maternal uterus/adnexae: Both ovaries are visualized and are normal. No adnexal mass. No pelvic free fluid. IMPRESSION: Intrauterine gestational sac containing a yolk sac and fetal pole but no cardiac activity demonstrated. Findings are suspicious but not yet definitive for failed pregnancy. Recommend follow-up US in 10-14 days for definitive diagnosis. This recommendation follows SRU consensus guidelines: Diagnostic Criteria for Nonviable Pregnancy Early  in the First Trimester. Malva Limes Med 2013; 409:8119-14. Electronically Signed   By: Rubye Oaks M.D.   On: 04/13/2018 06:10    Procedures Procedures (including critical care time)  Medications Ordered in ED Medications - No data to display   Initial Impression / Assessment and Plan / ED Course  I have reviewed the triage vital signs and the nursing notes.  Pertinent labs & imaging results that were available during my care of the patient were reviewed by me and considered in my medical decision making (see chart for details).    22 year old female presents today with vaginal bleeding and some cramping.  Ultrasound done here today reveals intrauterine gestational sac containing yolk sac and fetal pole but no cardiac activity is demonstrated findings are suspicious but not defining for failed pregnancy.  Plan follow-up ultrasound in 10 to 14 days for definite diagnosis.  I discussed the results the patient.  She is advised to follow-up at [facility] in 1 to 2 days.  We discussed other return precautions and need for follow-up and she voices understanding. Patient Rh-  Final Clinical Impressions(s) / ED Diagnoses   Final diagnoses:  Threatened abortion in first trimester    ED Discharge Orders    None       Margarita Grizzle, MD 04/13/18 0930

## 2018-04-13 NOTE — ED Notes (Signed)
Dahlia ClientHannah, GeorgiaPA notified of pt and orders placed.

## 2018-04-13 NOTE — ED Triage Notes (Addendum)
C/o vaginal bleeding x 1 hour.  Pt states she is [redacted] weeks pregnant.  Denies abd pain.  First US scheduled for Tuesday.

## 2018-04-15 LAB — GC/CHLAMYDIA PROBE AMP (~~LOC~~) NOT AT ARMC
CHLAMYDIA, DNA PROBE: NEGATIVE
Neisseria Gonorrhea: NEGATIVE

## 2018-04-16 ENCOUNTER — Ambulatory Visit: Payer: Self-pay | Admitting: Student

## 2018-04-16 DIAGNOSIS — O3680X0 Pregnancy with inconclusive fetal viability, not applicable or unspecified: Secondary | ICD-10-CM

## 2018-04-16 NOTE — Progress Notes (Signed)
Patient arrived for NOB visit this am. Denies pain or bleeding today. Given that patient newly diagnosed with threatened miscarriage on 8-3, consult with Dr. Macon LargeAnyanwu as to whether or not to proceed with NOB visit or have US today. Dr. Macon LargeAnyanwu recommends that patient have follow-up US next week and [facility] if pregnancy is [facility]. Patient verbalized understanding and is amenable to plan of care.   Luna KitchensKathryn Presleigh Feldstein CNM

## 2018-04-16 NOTE — BH Specialist Note (Unsigned)
Integrated Behavioral Health Initial Visit  MRN: [id number] Name: Crystal May  Number of Integrated Behavioral Health Clinician visits:: 1/6 Session Start time: ***  Session End time: *** Total time: {IBH Total Time:21014050}  Type of Service: Integrated Behavioral Health- Individual/Family Interpretor:No. Interpretor Name and Language: n/a   Warm Hand Off Completed.       SUBJECTIVE: Crystal May is a 22 y.o. female accompanied by {CHL AMB ACCOMPANIED WU:[phone]} Patient was referred by Luna KitchensKathryn Kooistra, CNM for initial OB introduction to integrated behavioral health services. Patient reports the following symptoms/concerns: *** Duration of problem: ***; Severity of problem: {Mild/Moderate/Severe:20260}  OBJECTIVE: Mood: {BHH MOOD:22306} and Affect: {BHH AFFECT:22307} Risk of harm to self or others: {CHL AMB BH Suicide Current Mental Status:21022748}  LIFE CONTEXT: Family and Social: *** School/Work: *** Self-Care: *** Life Changes: Current pregnancy ***  GOALS ADDRESSED: Patient will: 1. Reduce symptoms of: {IBH Symptoms:21014056} 2. Increase knowledge and/or ability of: {IBH Patient Tools:21014057}  3. Demonstrate ability to: {IBH Goals:21014053}  INTERVENTIONS: Interventions utilized: {IBH Interventions:21014054}  Standardized Assessments completed: GAD-7 and PHQ 9  ASSESSMENT: Patient currently experiencing Supervision of *** pregnancy ***.   Patient may benefit from initial OB introduction to integrated behavioral health services ***.  PLAN: 1. Follow up with behavioral health clinician on : *** 2. Behavioral recommendations:  -*** 3. Referral(s): {IBH Referrals:21014055} 4. "From scale of 1-10, how likely are you to follow plan?": ***  Valetta CloseJamie C McMannes, LCSW

## 2018-04-17 ENCOUNTER — Ambulatory Visit (INDEPENDENT_AMBULATORY_CARE_PROVIDER_SITE_OTHER): Payer: Self-pay | Admitting: Physician Assistant

## 2018-04-21 ENCOUNTER — Inpatient Hospital Stay (HOSPITAL_COMMUNITY)
Admission: AD | Admit: 2018-04-21 | Discharge: 2018-04-21 | Disposition: A | Discharge status: Home / Self Care | Payer: Self-pay | Attending: Obstetrics and Gynecology | Admitting: Obstetrics and Gynecology

## 2018-04-21 ENCOUNTER — Encounter (HOSPITAL_COMMUNITY): Payer: Self-pay | Admitting: *Deleted

## 2018-04-21 ENCOUNTER — Inpatient Hospital Stay (HOSPITAL_COMMUNITY): Payer: Self-pay

## 2018-04-21 ENCOUNTER — Other Ambulatory Visit: Payer: Self-pay

## 2018-04-21 DIAGNOSIS — Z9889 Other specified postprocedural states: Secondary | ICD-10-CM | POA: Insufficient documentation

## 2018-04-21 DIAGNOSIS — O021 Missed abortion: Secondary | ICD-10-CM | POA: Insufficient documentation

## 2018-04-21 DIAGNOSIS — Z79899 Other long term (current) drug therapy: Secondary | ICD-10-CM | POA: Insufficient documentation

## 2018-04-21 DIAGNOSIS — Z7722 Contact with and (suspected) exposure to environmental tobacco smoke (acute) (chronic): Secondary | ICD-10-CM | POA: Insufficient documentation

## 2018-04-21 DIAGNOSIS — Z3A01 Less than 8 weeks gestation of pregnancy: Secondary | ICD-10-CM

## 2018-04-21 DIAGNOSIS — M549 Dorsalgia, unspecified: Secondary | ICD-10-CM | POA: Insufficient documentation

## 2018-04-21 LAB — URINALYSIS, ROUTINE W REFLEX MICROSCOPIC
BILIRUBIN URINE: NEGATIVE
Glucose, UA: NEGATIVE mg/dL
KETONES UR: NEGATIVE mg/dL
NITRITE: NEGATIVE
PH: 8 (ref 5.0–8.0)
Protein, ur: 30 mg/dL — AB
SPECIFIC GRAVITY, URINE: 1.025 (ref 1.005–1.030)

## 2018-04-21 LAB — HCG, QUANTITATIVE, PREGNANCY: HCG, BETA CHAIN, QUANT, S: 9488 m[IU]/mL — AB (ref <5)

## 2018-04-21 LAB — CBC
HCT: 36.7 % (ref 36.0–46.0)
Hemoglobin: 12.1 g/dL (ref 12.0–15.0)
MCH: 25.7 pg — AB (ref 26.0–34.0)
MCHC: 33 g/dL (ref 30.0–36.0)
MCV: 77.9 fL — ABNORMAL LOW (ref 78.0–100.0)
PLATELETS: 219 10*3/uL (ref 150–400)
RBC: 4.71 MIL/uL (ref 3.87–5.11)
RDW: 14.4 % (ref 11.5–15.5)
WBC: 7 10*3/uL (ref 4.0–10.5)

## 2018-04-21 MED ORDER — ONDANSETRON 8 MG PO TBDP: 8.0000 mg | ORAL_TABLET | Freq: Three times a day (TID) | ORAL | 0 refills | Status: DC | PRN

## 2018-04-21 MED ORDER — RHO D IMMUNE GLOBULIN 1500 UNIT/2ML IJ SOSY
300.0000 ug | PREFILLED_SYRINGE | Freq: Once | INTRAMUSCULAR | Status: AC
  Administered 2018-04-21: 300 ug via INTRAMUSCULAR
  Filled 2018-04-21: qty 2

## 2018-04-21 MED ORDER — IBUPROFEN 800 MG PO TABS: 800.0000 mg | ORAL_TABLET | Freq: Three times a day (TID) | ORAL | 0 refills | Status: DC | PRN

## 2018-04-21 MED ORDER — HYDROCODONE-ACETAMINOPHEN 5-325 MG PO TABS: 2.0000 | ORAL_TABLET | ORAL | 0 refills | Status: DC | PRN

## 2018-04-21 MED ORDER — MISOPROSTOL 200 MCG PO TABS: 800.0000 ug | ORAL_TABLET | Freq: Once | ORAL | 1 refills | Status: DC

## 2018-04-21 NOTE — MAU Provider Note (Addendum)
History     CSN: [id number]  Arrival date and time: 04/21/18 1018   None     Chief Complaint  Patient presents with  . Vaginal Bleeding  . Back Pain   Crystal May is a 22 y.o. G1P0 at [redacted]w[redacted]d who presents today with vaginal bleeding. She reports that this started yesterday. She denies any pain.   Vaginal Bleeding  The patient's primary symptoms include pelvic pain and vaginal bleeding. This is a new problem. The current episode started yesterday. The problem occurs intermittently. The problem has been unchanged. The pain is mild. The problem affects both sides. She is pregnant. Pertinent negatives include no chills, dysuria, fever, frequency, nausea or vomiting. The vaginal bleeding is lighter than menses. She has been passing clots. She has not been passing tissue. Nothing aggravates the symptoms. She has tried nothing for the symptoms. She uses nothing for contraception.    OB History    Gravida  1   Para      Term      Preterm      AB      Living        SAB      TAB      Ectopic      Multiple      Live Births              Past Medical History:  Diagnosis Date  . Asthma   . Seasonal allergies     Past Surgical History:  Procedure Laterality Date  . KNEE SURGERY      Family History  Problem Relation Age of Onset  . Healthy Mother   . Healthy Father     Social History   Tobacco Use  . Smoking status: Passive Smoke Exposure - Never Smoker  . Smokeless tobacco: Never Used  Substance Use Topics  . Alcohol use: No  . Drug use: No    Allergies: No Known Allergies  Medications Prior to Admission  Medication Sig Dispense Refill Last Dose  . albuterol (PROVENTIL HFA;VENTOLIN HFA) 108 (90 Base) MCG/ACT inhaler Inhale 1-2 puffs into the lungs every 6 (six) hours as needed for wheezing or shortness of breath. 1 Inhaler 0   . albuterol (PROVENTIL) (2.5 MG/3ML) 0.083% nebulizer solution Take 3 mLs (2.5 mg total) by nebulization every 6 (six) hours  as needed for wheezing or shortness of breath. 75 mL 0   . cetirizine (ZYRTEC) 10 MG tablet Take 1 tablet (10 mg total) by mouth daily. 15 tablet 0   . Doxylamine-Pyridoxine 10-10 MG TBEC Take 10 mg by mouth daily. 60 tablet 0   . fluticasone (FLONASE) 50 MCG/ACT nasal spray Place 2 sprays into both nostrils daily. 1 g 0   . gabapentin (NEURONTIN) 300 MG capsule Take 1 capsule (300 mg total) by mouth 2 (two) times daily for 15 days. 30 capsule 0   . ipratropium (ATROVENT) 0.06 % nasal spray Place 2 sprays into both nostrils 4 (four) times daily. 15 mL 0   . ondansetron (ZOFRAN ODT) 4 MG disintegrating tablet Take 1 tablet (4 mg total) by mouth every 8 (eight) hours as needed for nausea or vomiting. 5 tablet 0     Review of Systems  Constitutional: Negative for chills and fever.  Gastrointestinal: Negative for nausea and vomiting.  Genitourinary: Positive for pelvic pain and vaginal bleeding. Negative for dysuria and frequency.   Physical Exam   Blood pressure 123/70, pulse 97, temperature 98 F (36.7 C), temperature source  Oral, resp. rate 20, height 5\' 2"  (1.575 m), weight 82.4 kg, last menstrual period 02/02/2018, SpO2 100 %.  Physical Exam  Nursing note and vitals reviewed. Constitutional: She is oriented to person, place, and time. She appears well-developed and well-nourished. No distress.  HENT:  Head: Normocephalic.  Cardiovascular: Normal rate.  Respiratory: Effort normal.  GI: Soft. There is no tenderness. There is no rebound.  Neurological: She is alert and oriented to person, place, and time.  Skin: Skin is warm and dry.  Psychiatric: She has a normal mood and affect.   Results for orders placed or performed during the hospital encounter of 04/21/18 (from the past 24 hour(s))  Urinalysis, Routine w reflex microscopic     Status: Abnormal   Collection Time: 04/21/18 12:08 PM  Result Value Ref Range   Color, Urine YELLOW YELLOW   APPearance HAZY (A) CLEAR   Specific  Gravity, Urine 1.025 1.005 - 1.030   pH 8.0 5.0 - 8.0   Glucose, UA NEGATIVE NEGATIVE mg/dL   Hgb urine dipstick SMALL (A) NEGATIVE   Bilirubin Urine NEGATIVE NEGATIVE   Ketones, ur NEGATIVE NEGATIVE mg/dL   Protein, ur 30 (A) NEGATIVE mg/dL   Nitrite NEGATIVE NEGATIVE   Leukocytes, UA TRACE (A) NEGATIVE   RBC / HPF 0-5 0 - 5 RBC/hpf   WBC, UA 0-5 0 - 5 WBC/hpf   Bacteria, UA RARE (A) NONE SEEN   Squamous Epithelial / LPF 0-5 0 - 5   Mucus PRESENT    Amorphous Crystal PRESENT    Sperm, UA PRESENT   CBC     Status: Abnormal   Collection Time: 04/21/18 12:20 PM  Result Value Ref Range   WBC 7.0 4.0 - 10.5 K/uL   RBC 4.71 3.87 - 5.11 MIL/uL   Hemoglobin 12.1 12.0 - 15.0 g/dL   HCT 16.136.7 09.636.0 - 04.546.0 %   MCV 77.9 (L) 78.0 - 100.0 fL   MCH 25.7 (L) 26.0 - 34.0 pg   MCHC 33.0 30.0 - 36.0 g/dL   RDW 40.914.4 81.111.5 - 91.415.5 %   Platelets 219 150 - 400 K/uL  hCG, quantitative, pregnancy     Status: Abnormal   Collection Time: 04/21/18 12:20 PM  Result Value Ref Range   hCG, Beta Chain, Quant, S 9,488 (H) <5 mIU/mL   [place] Ob Transvaginal  Result Date: 04/21/2018 CLINICAL DATA:  Pregnant EXAM: TRANSVAGINAL OB ULTRASOUND TECHNIQUE: Transvaginal ultrasound was performed for complete evaluation of the gestation as well as the maternal uterus, adnexal regions, and pelvic cul-de-sac. COMPARISON:  04/13/2018 FINDINGS: Intrauterine gestational sac: Single, with irregular/angular margins Yolk sac:  Visualized. Embryo:  Visualized. Cardiac Activity: Not Visualized. MSD: 28.4 mm   7 w   6 d CRL:   5.4 mm   6 w 1 d Subchorionic hemorrhage:  None visualized. Maternal uterus/adnexae: Bilateral ovaries are within normal limits. No free fluid. IMPRESSION: Single intrauterine gestational sac with small fetal pole but without cardiac activity, as described above. Based on the size of the mean sac diameter, findings meet definitive criteria for failed pregnancy. This follows SRU consensus guidelines: Diagnostic Criteria  for Nonviable Pregnancy Early in the First Trimester. Macy Mis Engl J Med [phone]. Electronically Signed   By: Charline BillsSriyesh  Krishnan M.D.   On: 04/21/2018 14:08   MAU Course  Procedures  MDM Rhogam given today  DW patient expectant management v Cytotec. She would like to proceed with Cytotec.   Assessment and Plan   1. Missed abortion  2. [redacted] weeks gestation of pregnancy    DC home Comfort measures reviewed  Bleeding precautions RX: cytotec 800mg  as directed, vicodin PRN #10, ibuprofen 800mg  PRN, Zofran 8mg  ODT PRN  Return to MAU as needed FU with OB as planned  Follow-up Information    [facility].   Specialty:  Obstetrics and Gynecology Contact information: [address] [place] [address] [phone]           Thressa Sheller 04/21/2018, 2:42 PM

## 2018-04-21 NOTE — MAU Note (Signed)
Pt presents with c/o spotting and lower back pain that last night.  +UPT in June.

## 2018-04-21 NOTE — Discharge Instructions (Signed)

## 2018-04-22 ENCOUNTER — Ambulatory Visit (HOSPITAL_COMMUNITY): Payer: Self-pay

## 2018-04-22 LAB — RH IG WORKUP (INCLUDES ABO/RH)
ABO/RH(D): O NEG
ANTIBODY SCREEN: NEGATIVE
Gestational Age(Wks): 7
Unit division: 0

## 2018-04-23 ENCOUNTER — Ambulatory Visit (HOSPITAL_COMMUNITY): Payer: Self-pay

## 2018-05-06 ENCOUNTER — Ambulatory Visit (INDEPENDENT_AMBULATORY_CARE_PROVIDER_SITE_OTHER): Payer: Self-pay | Admitting: Physician Assistant

## 2018-05-08 ENCOUNTER — Encounter: Payer: Self-pay | Admitting: Family Medicine

## 2018-05-08 ENCOUNTER — Encounter: Payer: Self-pay | Admitting: Certified Nurse Midwife

## 2018-05-08 ENCOUNTER — Ambulatory Visit (INDEPENDENT_AMBULATORY_CARE_PROVIDER_SITE_OTHER): Payer: Self-pay | Admitting: Certified Nurse Midwife

## 2018-05-08 VITALS — BP 135/96 | HR 95 | Wt 177.7 lb

## 2018-05-08 DIAGNOSIS — O021 Missed abortion: Secondary | ICD-10-CM

## 2018-05-08 DIAGNOSIS — R109 Unspecified abdominal pain: Secondary | ICD-10-CM

## 2018-05-08 NOTE — Patient Instructions (Signed)

## 2018-05-08 NOTE — Progress Notes (Signed)
Bleeding stopped today. Cramping 10/10 while eating. Back pain started yesterday. Right side pain.

## 2018-05-08 NOTE — Progress Notes (Signed)
  Subjective:     Patient ID: Kai LevinsAvaina Apperson, female   DOB: 1996-07-14, 22 y.o.   MRN: [id number]  HPI Patient present to clinic for f/u visit from missed abortion that was diagnosed on 8/11- patient was given Cytotec in MAU for missed abortion. Patient reports vaginal bleeding that started on 8/12 and continued bleeding until this morning prior to appointment. She reports continued abdominal cramping that occurs intermittently only when she eats, rates pain 10/10- has not taken any medication for abdominal cramping. She reports cramping radiates around to her back but denies pain occurring outside of when she is sitting up to eat.   Review of Systems  Constitutional: Negative.   Respiratory: Negative.   Cardiovascular: Negative.   Gastrointestinal: Positive for abdominal pain. Negative for constipation, diarrhea, nausea and vomiting.  Genitourinary: Positive for vaginal bleeding. Negative for difficulty urinating, dysuria, frequency and vaginal discharge.  Musculoskeletal: Positive for back pain.    Objective:   Physical Exam  Constitutional: She is oriented to person, place, and time. She appears well-developed and well-nourished. No distress.  HENT:  Head: Normocephalic.  Cardiovascular: Normal rate, regular rhythm and normal heart sounds.  Pulmonary/Chest: Effort normal and breath sounds normal. No respiratory distress. She has no wheezes.  Abdominal: Soft. Bowel sounds are normal. She exhibits no distension. There is no tenderness. There is no guarding.  Neurological: She is alert and oriented to person, place, and time.  Skin: Skin is warm and dry.  Psychiatric: She has a normal mood and affect. Her behavior is normal. Thought content normal.  Nursing note and vitals reviewed.  Vitals:   05/08/18 1741  BP: (!) 135/96  Pulse: 95    Assessment/Plan:     1. Missed abortion -Discussed plan of care with patient with lab work today and possible US at later date depending on  results of lab work  -Patient does not desire pregnancy at this time but declines birth control and is planning on using condoms  -Educated and discussed expectations of menstrual cycle after miscarriage with possibility of heavier bleeding- patient verbalizes understanding  - Beta hCG quant (ref lab) - CBC  2. Abdominal cramping -Educated and discussed use of Ibuprofen or Tylenol for abdominal cramping, use of heating packs and positioning.     Will call patient with results of HCG and manage accordingly   Sharyon CableVeronica C Yong Grieser, CNM 05/08/18

## 2018-05-09 LAB — CBC
Hematocrit: 34.8 % (ref 34.0–46.6)
Hemoglobin: 11.5 g/dL (ref 11.1–15.9)
MCH: 25.2 pg — ABNORMAL LOW (ref 26.6–33.0)
MCHC: 33 g/dL (ref 31.5–35.7)
MCV: 76 fL — ABNORMAL LOW (ref 79–97)
Platelets: 276 10*3/uL (ref 150–450)
RBC: 4.56 x10E6/uL (ref 3.77–5.28)
RDW: 15.1 % (ref 12.3–15.4)
WBC: 6.6 10*3/uL (ref 3.4–10.8)

## 2018-05-09 LAB — BETA HCG QUANT (REF LAB): hCG Quant: 19 m[IU]/mL

## 2018-09-11 NOTE — L&D Delivery Note (Addendum)
Delivery Note:  G2P0010 at [redacted]w[redacted]d  Admitting diagnosis: Pregnancy  Risks: None Onset of labor: 06/18/2019 @ 2101 Augmentation: Pitocin, Cytotec and Foley Balloon ROM: SROM 06/18/2019 @ 2008  Complete dilation at 06/19/2019  @ 0204 Onset of pushing at 0548 - second attempt of pushing FHR second stage category 2 - occasional variables with ctx while pushing  Analgesia /Anesthesia intrapartum:Epidural  Pushing in lithotomy position. CNM, RN, and ROB present for birth and supportive  Delivery of a Live born female  Birth Weight:   APGAR: 43, 9  Newborn Delivery   Birth date/time: 06/19/2019 08:21:00 Delivery type: Vaginal, Spontaneous      in vertex presentation, position LOA to LOT.  Nuchal Cord: No  Cord double clamped after cessation of pulsation, cut by FOB.  Collection of cord blood for typing completed. Cord blood donation-None  Arterial cord blood sample-No    Placenta delivered-Spontaneous  with 3 vessels . Uterotonics: Pitocin administered IV Placenta to L&D to be discarded. Uterine tone firm, bleeding moderate.  Left sulcus laceration identified. 3-0 monocryl used for left sulcus repair in standard fashion. Episiotomy:None  Local analgesia: 1% lidocaine administered prior to repair  Repair: Left Sulcus Est. Blood Loss (mL): 716 mL Complications: None  APGAR:1 min-8 , 5 min-9  10 min-   Mom to postpartum.  Baby to Couplet care / Skin to Skin.  Delivery Report:  Review the Delivery Report for details.     Signed: Juanna May, SNM, BSN 06/19/2019, 8:58 AM

## 2018-11-03 ENCOUNTER — Encounter (HOSPITAL_COMMUNITY): Payer: Self-pay | Admitting: Emergency Medicine

## 2018-11-03 ENCOUNTER — Other Ambulatory Visit: Payer: Self-pay

## 2018-11-03 ENCOUNTER — Ambulatory Visit (HOSPITAL_COMMUNITY)
Admission: EM | Admit: 2018-11-03 | Discharge: 2018-11-03 | Disposition: A | Discharge status: Home / Self Care | Payer: Self-pay | Attending: Internal Medicine | Admitting: Internal Medicine

## 2018-11-03 DIAGNOSIS — Z3A01 Less than 8 weeks gestation of pregnancy: Secondary | ICD-10-CM

## 2018-11-03 DIAGNOSIS — Z3201 Encounter for pregnancy test, result positive: Secondary | ICD-10-CM

## 2018-11-03 LAB — POCT PREGNANCY, URINE: PREG TEST UR: POSITIVE — AB

## 2018-11-03 NOTE — ED Triage Notes (Signed)
The patient presented to the [facility] with a complaint of missing her period. The patient reported a positive pregnancy test at home. The patient denied any pain and requested a pregnancy test.

## 2018-11-03 NOTE — ED Provider Notes (Signed)
[facility]    CSN: [id number] Arrival date & time: 11/03/18  1201     History   Chief Complaint Chief Complaint  Patient presents with  . Amenorrhea    HPI Crystal May is a 23 y.o. female comes to the urgent care today with complaints of amenorrhea.  Patient thinks that she might be pregnant and wants to be evaluated for that.  She denies any nausea or recurrent vomiting.  Her menstrual cycles have been regular prior to this month.  Patient last pregnancy in June last year-spontaneous abortion.  HPI  Past Medical History:  Diagnosis Date  . Asthma   . Seasonal allergies     There are no active problems to display for this patient.   Past Surgical History:  Procedure Laterality Date  . KNEE SURGERY      OB History    Gravida  1   Para      Term      Preterm      AB      Living        SAB      TAB      Ectopic      Multiple      Live Births               Home Medications    Prior to Admission medications   Medication Sig Start Date End Date Taking? Authorizing Provider  Prenatal Vit-Fe Fumarate-FA (PRENATAL MULTIVITAMIN) TABS tablet Take 1 tablet by mouth daily at 12 noon.   Yes [provider]    Family History Family History  Problem Relation Age of Onset  . Healthy Mother   . Healthy Father     Social History Social History   Tobacco Use  . Smoking status: Passive Smoke Exposure - Never Smoker  . Smokeless tobacco: Never Used  Substance Use Topics  . Alcohol use: No  . Drug use: No     Allergies   Patient has no known allergies.   Review of Systems Review of Systems  Respiratory: Negative for cough.   Gastrointestinal: Negative for abdominal distention and abdominal pain.  Genitourinary: Negative for difficulty urinating, dyspareunia, dysuria, frequency, urgency, vaginal bleeding and vaginal discharge.  Musculoskeletal: Negative for arthralgias, back pain, joint swelling and myalgias.    Neurological: Negative for dizziness, syncope, weakness and light-headedness.     Physical Exam Triage Vital Signs ED Triage Vitals  Enc Vitals Group     BP 11/03/18 1219 120/68     Pulse Rate 11/03/18 1219 87     Resp 11/03/18 1219 16     Temp 11/03/18 1219 98.4 F (36.9 C)     Temp Source 11/03/18 1219 Temporal     SpO2 11/03/18 1219 100 %     Weight --      Height --      Head Circumference --      Peak Flow --      Pain Score 11/03/18 1217 0     Pain Loc --      Pain Edu? --      Excl. in GC? --    No data found.  Updated Vital Signs BP 120/68 (BP Location: Right Arm)   Pulse 87   Temp 98.4 F (36.9 C) (Temporal)   Resp 16   LMP  (LMP Unknown) Comment: Previous miscarriage  SpO2 100%   Breastfeeding Unknown   Visual Acuity Right Eye Distance:   Left Eye Distance:  Bilateral Distance:    Right Eye Near:   Left Eye Near:    Bilateral Near:     Physical Exam Vitals signs and nursing note reviewed.  Constitutional:      General: She is not in acute distress.    Appearance: Normal appearance. She is not ill-appearing.  HENT:     Head: Normocephalic and atraumatic.     Right Ear: Tympanic membrane normal.     Left Ear: Tympanic membrane normal.     Nose: Nose normal.     Mouth/Throat:     Mouth: Mucous membranes are moist.  Neck:     Musculoskeletal: Normal range of motion and neck supple.  Cardiovascular:     Rate and Rhythm: Normal rate and regular rhythm.     Pulses: Normal pulses.     Heart sounds: Normal heart sounds.  Pulmonary:     Effort: Pulmonary effort is normal.  Abdominal:     General: Bowel sounds are normal.     Palpations: Abdomen is soft.  Musculoskeletal: Normal range of motion.  Neurological:     Mental Status: She is alert.      UC Treatments / Results  Labs (all labs ordered are listed, but only abnormal results are displayed) Labs Reviewed  POCT PREGNANCY, URINE - Abnormal; Notable for the following components:       Result Value   Preg Test, Ur POSITIVE (*)    All other components within normal limits  POC URINE PREG, ED    EKG None  Radiology No results found.  Procedures Procedures (including critical care time)  Medications Ordered in UC Medications - No data to display  Initial Impression / Assessment and Plan / UC Course  I have reviewed the triage vital signs and the nursing notes.  Pertinent labs & imaging results that were available during my care of the patient were reviewed by me and considered in my medical decision making (see chart for details).     1.  Amenorrhea: Pregnancy test was positive Patient is on prenatal vitamins She is advised to refrain from NSAID use or aspirin Final Clinical Impressions(s) / UC Diagnoses   Final diagnoses:  Less than [redacted] weeks gestation of pregnancy   Discharge Instructions   None    ED Prescriptions    None     Controlled Substance Prescriptions [place] Controlled Substance Registry consulted? No   Merrilee Jansky, MD 11/03/18 [phone]

## 2018-11-21 ENCOUNTER — Other Ambulatory Visit: Payer: Self-pay

## 2018-11-21 ENCOUNTER — Ambulatory Visit (HOSPITAL_COMMUNITY)
Admission: EM | Admit: 2018-11-21 | Discharge: 2018-11-21 | Disposition: A | Discharge status: Home / Self Care | Payer: Self-pay | Attending: Family Medicine | Admitting: Family Medicine

## 2018-11-21 ENCOUNTER — Encounter (HOSPITAL_COMMUNITY): Payer: Self-pay

## 2018-11-21 DIAGNOSIS — J069 Acute upper respiratory infection, unspecified: Secondary | ICD-10-CM

## 2018-11-21 DIAGNOSIS — B9789 Other viral agents as the cause of diseases classified elsewhere: Secondary | ICD-10-CM

## 2018-11-21 NOTE — Discharge Instructions (Addendum)
I believe  this is an upper respiratory virus or the common cold. I will give you a list of medication that are safe in pregnancy. Stay hydrated.  Follow up as needed for continued or worsening symptoms

## 2018-11-21 NOTE — ED Triage Notes (Signed)
Pt presents with upper respiratory cold symptoms; congestion, nasal drainage, sore throat, and vomiting mucus X 4 days

## 2018-11-21 NOTE — ED Provider Notes (Signed)
[facility]    CSN: [id number] Arrival date & time: 11/21/18  1402     History   Chief Complaint Chief Complaint  Patient presents with  . URI    HPI Crystal May is a 23 y.o. female.    URI  Presenting symptoms: congestion, cough, fever, rhinorrhea and sore throat   Severity:  Moderate Duration:  4 days Timing:  Constant Progression:  Unchanged Chronicity:  New Relieved by:  Nothing Worsened by:  Nothing Ineffective treatments:  None tried Associated symptoms: no arthralgias, no headaches, no myalgias, no neck pain, no sinus pain, no sneezing, no swollen glands and no wheezing   Risk factors: no immunosuppression, no recent illness, no recent travel and no sick contacts     Past Medical History:  Diagnosis Date  . Asthma   . Seasonal allergies     There are no active problems to display for this patient.   Past Surgical History:  Procedure Laterality Date  . KNEE SURGERY      OB History    Gravida  2   Para      Term      Preterm      AB      Living        SAB      TAB      Ectopic      Multiple      Live Births               Home Medications    Prior to Admission medications   Medication Sig Start Date End Date Taking? Authorizing Provider  Prenatal Vit-Fe Fumarate-FA (PRENATAL MULTIVITAMIN) TABS tablet Take 1 tablet by mouth daily at 12 noon.    [provider]    Family History Family History  Problem Relation Age of Onset  . Healthy Mother   . Healthy Father     Social History Social History   Tobacco Use  . Smoking status: Passive Smoke Exposure - Never Smoker  . Smokeless tobacco: Never Used  Substance Use Topics  . Alcohol use: No  . Drug use: No     Allergies   Patient has no known allergies.   Review of Systems Review of Systems  Constitutional: Positive for fever.  HENT: Positive for congestion, rhinorrhea and sore throat. Negative for sinus pain and sneezing.    Respiratory: Positive for cough. Negative for wheezing.   Musculoskeletal: Negative for arthralgias, myalgias and neck pain.  Neurological: Negative for headaches.     Physical Exam Triage Vital Signs ED Triage Vitals  Enc Vitals Group     BP 11/21/18 1430 124/77     Pulse Rate 11/21/18 1430 94     Resp 11/21/18 1430 20     Temp 11/21/18 1430 98.4 F (36.9 C)     Temp Source 11/21/18 1430 Oral     SpO2 11/21/18 1430 99 %     Weight --      Height --      Head Circumference --      Peak Flow --      Pain Score 11/21/18 1432 3     Pain Loc --      Pain Edu? --      Excl. in GC? --    No data found.  Updated Vital Signs BP 124/77 (BP Location: Right Arm)   Pulse 94   Temp 98.4 F (36.9 C) (Oral)   Resp 20   LMP 09/18/2018  Comment: Previous miscarriage  SpO2 99%   Visual Acuity Right Eye Distance:   Left Eye Distance:   Bilateral Distance:    Right Eye Near:   Left Eye Near:    Bilateral Near:     Physical Exam Vitals signs and nursing note reviewed.  Constitutional:      General: She is not in acute distress.    Appearance: Normal appearance. She is not ill-appearing, toxic-appearing or diaphoretic.  HENT:     Head: Normocephalic and atraumatic.     Right Ear: Tympanic membrane and ear canal normal.     Left Ear: Tympanic membrane and ear canal normal.     Nose: Congestion present.     Mouth/Throat:     Pharynx: Oropharynx is clear. No posterior oropharyngeal erythema.  Eyes:     Conjunctiva/sclera: Conjunctivae normal.  Neck:     Musculoskeletal: Normal range of motion.  Cardiovascular:     Rate and Rhythm: Normal rate and regular rhythm.     Pulses: Normal pulses.     Heart sounds: Normal heart sounds.  Pulmonary:     Effort: Pulmonary effort is normal.     Breath sounds: Normal breath sounds.  Musculoskeletal: Normal range of motion.  Lymphadenopathy:     Cervical: No cervical adenopathy.  Skin:    General: Skin is warm and dry.   Neurological:     Mental Status: She is alert.  Psychiatric:        Mood and Affect: Mood normal.      UC Treatments / Results  Labs (all labs ordered are listed, but only abnormal results are displayed) Labs Reviewed - No data to display  EKG None  Radiology No results found.  Procedures Procedures (including critical care time)  Medications Ordered in UC Medications - No data to display  Initial Impression / Assessment and Plan / UC Course  I have reviewed the triage vital signs and the nursing notes.  Pertinent labs & imaging results that were available during my care of the patient were reviewed by me and considered in my medical decision making (see chart for details).    Symptoms consistent with viral URI Symptomatic treatment List given medications that are safe in pregnancy Instructed to stay hydrated Follow up as needed for continued or worsening symptoms  Final Clinical Impressions(s) / UC Diagnoses   Final diagnoses:  Viral URI with cough     Discharge Instructions     I believe  this is an upper respiratory virus or the common cold. I will give you a list of medication that are safe in pregnancy. Stay hydrated.  Follow up as needed for continued or worsening symptoms     ED Prescriptions    None     Controlled Substance Prescriptions [place] Controlled Substance Registry consulted? Not Applicable   Janace Aris, NP 11/21/18 1520

## 2018-11-28 ENCOUNTER — Encounter (HOSPITAL_COMMUNITY): Payer: Self-pay | Admitting: Emergency Medicine

## 2018-11-28 ENCOUNTER — Other Ambulatory Visit: Payer: Self-pay

## 2018-11-28 ENCOUNTER — Emergency Department (HOSPITAL_COMMUNITY)
Admission: EM | Admit: 2018-11-28 | Discharge: 2018-11-28 | Disposition: A | Discharge status: Home / Self Care | Payer: Self-pay | Attending: Emergency Medicine | Admitting: Emergency Medicine

## 2018-11-28 DIAGNOSIS — Z7722 Contact with and (suspected) exposure to environmental tobacco smoke (acute) (chronic): Secondary | ICD-10-CM | POA: Insufficient documentation

## 2018-11-28 DIAGNOSIS — J45909 Unspecified asthma, uncomplicated: Secondary | ICD-10-CM | POA: Insufficient documentation

## 2018-11-28 DIAGNOSIS — R112 Nausea with vomiting, unspecified: Secondary | ICD-10-CM | POA: Insufficient documentation

## 2018-11-28 MED ORDER — ONDANSETRON 4 MG PO TBDP: 4.0000 mg | ORAL_TABLET | Freq: Three times a day (TID) | ORAL | 1 refills | Status: DC | PRN

## 2018-11-28 MED ORDER — ONDANSETRON 4 MG PO TBDP
8.0000 mg | ORAL_TABLET | Freq: Once | ORAL | Status: AC
  Administered 2018-11-28: 8 mg via ORAL
  Filled 2018-11-28: qty 2

## 2018-11-28 NOTE — ED Triage Notes (Signed)
stataes was told she was preg 10 weeks now states she has had vomititng in the am and at random times

## 2018-11-28 NOTE — Discharge Instructions (Signed)
Prescription for nausea medication.  Need to get an OB/GYN doctor.

## 2018-11-28 NOTE — ED Provider Notes (Signed)
[facility] EMERGENCY DEPARTMENT Provider Note   CSN: [id number] Arrival date & time: 11/28/18  1442    History   Chief Complaint Chief Complaint  Patient presents with  . Vomiting    HPI Crystal May is a 23 y.o. female.     G2, P0 Ab1 approximately [redacted] weeks pregnant with no prenatal care presents with intermittent vomiting.  No fever, chills, dysuria, vaginal bleeding, vaginal discharge.  She does not feel dehydrated.  Severity is mild.  Nothing make symptoms better or worse.     Past Medical History:  Diagnosis Date  . Asthma   . Seasonal allergies     There are no active problems to display for this patient.   Past Surgical History:  Procedure Laterality Date  . KNEE SURGERY       OB History    Gravida  2   Para      Term      Preterm      AB      Living        SAB      TAB      Ectopic      Multiple      Live Births               Home Medications    Prior to Admission medications   Medication Sig Start Date End Date Taking? Authorizing Provider  ondansetron (ZOFRAN ODT) 4 MG disintegrating tablet Take 1 tablet (4 mg total) by mouth every 8 (eight) hours as needed for nausea or vomiting. 11/28/18   Donnetta Hutching, MD  Prenatal Vit-Fe Fumarate-FA (PRENATAL MULTIVITAMIN) TABS tablet Take 1 tablet by mouth daily at 12 noon.    [provider]    Family History Family History  Problem Relation Age of Onset  . Healthy Mother   . Healthy Father     Social History Social History   Tobacco Use  . Smoking status: Passive Smoke Exposure - Never Smoker  . Smokeless tobacco: Never Used  Substance Use Topics  . Alcohol use: No  . Drug use: No     Allergies   Patient has no known allergies.   Review of Systems Review of Systems  All other systems reviewed and are negative.    Physical Exam Updated Vital Signs BP (!) 139/98 (BP Location: Right Arm)   Pulse 98   Temp 99 F (37.2 C) (Oral)    Resp 16   LMP  (LMP Unknown) Comment: Previous miscarriage  SpO2 100%   Physical Exam Vitals signs and nursing note reviewed.  Constitutional:      Appearance: She is well-developed.     Comments: nad  HENT:     Head: Normocephalic and atraumatic.  Eyes:     Conjunctiva/sclera: Conjunctivae normal.  Neck:     Musculoskeletal: Neck supple.  Cardiovascular:     Rate and Rhythm: Normal rate and regular rhythm.  Pulmonary:     Effort: Pulmonary effort is normal.     Breath sounds: Normal breath sounds.  Abdominal:     General: Bowel sounds are normal.     Palpations: Abdomen is soft.  Musculoskeletal: Normal range of motion.  Skin:    General: Skin is warm and dry.  Neurological:     Mental Status: She is alert and oriented to person, place, and time.  Psychiatric:        Behavior: Behavior normal.      ED Treatments / Results  Labs (all labs ordered are listed, but only abnormal results are displayed) Labs Reviewed - No data to display  EKG None  Radiology No results found.  Procedures Procedures (including critical care time)  Medications Ordered in ED Medications  ondansetron (ZOFRAN-ODT) disintegrating tablet 8 mg (8 mg Oral Given 11/28/18 1542)     Initial Impression / Assessment and Plan / ED Course  I have reviewed the triage vital signs and the nursing notes.  Pertinent labs & imaging results that were available during my care of the patient were reviewed by me and considered in my medical decision making (see chart for details).        Patient is pregnant and nontoxic-appearing.  She reports intermittent vomiting.  Will Rx Zofran 4 mg ODT.  No testing required.  Final Clinical Impressions(s) / ED Diagnoses   Final diagnoses:  Intractable vomiting with nausea, unspecified vomiting type    ED Discharge Orders         Ordered    ondansetron (ZOFRAN ODT) 4 MG disintegrating tablet  Every 8 hours PRN     11/28/18 1632           Donnetta Hutching, MD 11/28/18 1645

## 2018-12-03 ENCOUNTER — Other Ambulatory Visit: Payer: Self-pay

## 2018-12-03 ENCOUNTER — Ambulatory Visit (INDEPENDENT_AMBULATORY_CARE_PROVIDER_SITE_OTHER): Payer: Self-pay | Admitting: *Deleted

## 2018-12-03 DIAGNOSIS — Z3490 Encounter for supervision of normal pregnancy, unspecified, unspecified trimester: Secondary | ICD-10-CM

## 2018-12-03 DIAGNOSIS — O99519 Diseases of the respiratory system complicating pregnancy, unspecified trimester: Secondary | ICD-10-CM

## 2018-12-03 DIAGNOSIS — J45909 Unspecified asthma, uncomplicated: Secondary | ICD-10-CM

## 2018-12-03 DIAGNOSIS — O09299 Supervision of pregnancy with other poor reproductive or obstetric history, unspecified trimester: Secondary | ICD-10-CM

## 2018-12-03 MED ORDER — PROMETHAZINE HCL 25 MG PO TABS: 25.0000 mg | ORAL_TABLET | Freq: Four times a day (QID) | ORAL | 1 refills | Status: DC | PRN

## 2018-12-03 NOTE — Progress Notes (Signed)
I connected with  Crystal May on 12/03/18 at 9:45 AM EDT by telephone and verified that I am speaking with the correct person using two identifiers.   I discussed the limitations, risks, security and privacy concerns of performing an evaluation and management service by telephone and the availability of in person appointments. I also discussed with the patient that there may be a patient responsible charge related to this service. The patient expressed understanding and agreed to proceed.  New Ob intake completed. Pt will have prenatal labs done @ initial prenatal visit scheduled on 4/7. Pt agreed to [employer] optimization schedule.   [place], Drucilla Schmidt, RN 12/03/2018  9:51 AM

## 2018-12-05 DIAGNOSIS — O09299 Supervision of pregnancy with other poor reproductive or obstetric history, unspecified trimester: Secondary | ICD-10-CM | POA: Insufficient documentation

## 2018-12-05 DIAGNOSIS — J45909 Unspecified asthma, uncomplicated: Secondary | ICD-10-CM | POA: Insufficient documentation

## 2018-12-05 DIAGNOSIS — Z3490 Encounter for supervision of normal pregnancy, unspecified, unspecified trimester: Secondary | ICD-10-CM

## 2018-12-05 DIAGNOSIS — O0990 Supervision of high risk pregnancy, unspecified, unspecified trimester: Secondary | ICD-10-CM | POA: Insufficient documentation

## 2018-12-05 DIAGNOSIS — O99519 Diseases of the respiratory system complicating pregnancy, unspecified trimester: Secondary | ICD-10-CM

## 2018-12-17 ENCOUNTER — Encounter: Payer: Self-pay | Admitting: Student

## 2018-12-23 ENCOUNTER — Encounter: Payer: Self-pay | Admitting: Advanced Practice Midwife

## 2018-12-30 ENCOUNTER — Telehealth: Payer: Self-pay | Admitting: Family Medicine

## 2018-12-30 NOTE — Telephone Encounter (Signed)
Called the patient to inform of upcoming appointment. Received the messge -The mailbox is full and can not accept any messages at this time. Good-bye

## 2018-12-31 ENCOUNTER — Ambulatory Visit (INDEPENDENT_AMBULATORY_CARE_PROVIDER_SITE_OTHER): Payer: Self-pay | Admitting: Student

## 2018-12-31 ENCOUNTER — Other Ambulatory Visit (HOSPITAL_COMMUNITY): Admission: RE | Admit: 2018-12-31 | Payer: Medicaid Other | Source: Ambulatory Visit

## 2018-12-31 ENCOUNTER — Encounter: Payer: Self-pay | Admitting: Student

## 2018-12-31 ENCOUNTER — Other Ambulatory Visit: Payer: Self-pay

## 2018-12-31 VITALS — BP 124/76 | HR 99 | Temp 98.6°F | Wt 193.0 lb

## 2018-12-31 DIAGNOSIS — O99519 Diseases of the respiratory system complicating pregnancy, unspecified trimester: Secondary | ICD-10-CM

## 2018-12-31 DIAGNOSIS — Z3492 Encounter for supervision of normal pregnancy, unspecified, second trimester: Secondary | ICD-10-CM

## 2018-12-31 DIAGNOSIS — O26899 Other specified pregnancy related conditions, unspecified trimester: Secondary | ICD-10-CM

## 2018-12-31 DIAGNOSIS — O26892 Other specified pregnancy related conditions, second trimester: Secondary | ICD-10-CM

## 2018-12-31 DIAGNOSIS — Z3A14 14 weeks gestation of pregnancy: Secondary | ICD-10-CM

## 2018-12-31 DIAGNOSIS — J45909 Unspecified asthma, uncomplicated: Secondary | ICD-10-CM

## 2018-12-31 DIAGNOSIS — Z6791 Unspecified blood type, Rh negative: Secondary | ICD-10-CM

## 2018-12-31 DIAGNOSIS — O99512 Diseases of the respiratory system complicating pregnancy, second trimester: Secondary | ICD-10-CM

## 2018-12-31 NOTE — Progress Notes (Signed)
  Subjective:    Crystal May is being seen today for her first obstetrical visit.  This is a planned pregnancy. She is at [redacted]w[redacted]d gestation. Her obstetrical history is significant for had a prior miscarriage; she denies history of blood pressure or diabetes. . Relationship with FOB: significant other, living together. Patient does intend to breast feed. Pregnancy history fully reviewed.  Patient reports no complaints. .  Review of Systems:   Review of Systems  Constitutional: Negative.   HENT: Negative.   Respiratory: Negative.   Gastrointestinal: Negative.   Genitourinary: Negative.   Musculoskeletal: Negative.   Neurological: Negative.   Psychiatric/Behavioral: Negative.     Objective:     BP 124/76   Pulse 99   Temp 98.6 F (37 C)   Wt 193 lb (87.5 kg)   LMP 09/18/2018 (Exact Date) Comment: Previous miscarriage  BMI 35.30 kg/m  Physical Exam  Constitutional: She appears well-developed.  HENT:  Head: Normocephalic.  Neck: Normal range of motion.  Respiratory: Effort normal. No respiratory distress.  GI: Soft.  Musculoskeletal: Normal range of motion.  Neurological: She is alert.  Skin: Skin is warm and dry.    Exam    Assessment:    Pregnancy: G2P0010 Patient Active Problem List   Diagnosis Date Noted  . Rh negative state in antepartum period 12/31/2018  . Supervision of low-risk pregnancy 12/05/2018  . Asthma affecting pregnancy, antepartum 12/05/2018  . History of miscarriage, currently pregnant 12/05/2018       Plan:     Initial labs drawn. Prenatal vitamins. Problem list reviewed and updated. AFP3 discussed: will do at visit at 20 weeks. . Will also do Panorama and Inheritest at that visit; she is concerned about being billed for the service before her pregnancy Medicaid is active.  Role of ultrasound in pregnancy discussed; fetal survey: ordered. Amniocentesis discussed: not indicated. Follow up in 6 weeks. 50% of visit spent on  counseling and coordination of care.  -Patient downloaded Webex onto her phone; knows that her next visit may be a virtual visit. She will come back for lab draw for genetic testing in 4-6 weeks once her pregnancy Medicaid is approved. She will get basic OB labs today -She has downloaded [employer] knows that her cuff is coming in the mail.  The nature of [facility] with multiple MDs and other Advanced Practice Providers was explained to patient; also emphasized that residents, students are part of our team.  Charlesetta Garibaldi [facility] 01/01/2019

## 2018-12-31 NOTE — Patient Instructions (Signed)
AREA PEDIATRIC/FAMILY PRACTICE PHYSICIANS  [place] ([address]) . [facility] o Chambliss, MD; Eniola, MD; Hale, MD; Hensel, MD; McDiarmid, MD; McIntyer, MD; Malessa Zartman, MD; Walden, MD o [address] [place], [place] [address] o [phone] o Mon-Fri 8:30-12:30, 1:30-5:00 o Providers come to see babies at [facility] o Accepting Medicaid . [facility] o Limited providers who accept newborns: Koirala, MD; Morrow, MD; Wolters, MD o [address], [place], [place] [address] o [phone] o Mon-Fri 8:00-5:30 o Babies seen by providers at [facility] o Does NOT accept Medicaid o Please call early in hospitalization for appointment (limited availability)  . [facility] o Mulberry, MD o [address] [place], [place] [address] o [phone] o Mon, Tue, Thur, Fri 8:30-5:00, Wed 10:00-7:00 (closed 1-2pm) o Babies seen by [facility] providers o Accepting Medicaid . [facility] Pediatric[facility] o Rubin, MD o [address], [place], [place] [address] o [phone] o Mon-Fri 8:30-5:00, Sat 8:30-12:00 o Provider comes to see babies at [facility] o Accepting Medicaid o Must have been referred from current patients or contacted office prior to delivery . [facility] ([facility]) o Brown, MD; Chandler, MD; Ettefagh, MD; Grant, MD; Lester, MD; McCormick, MD; McQueen, MD; Prose, MD; Simha, MD; Stanley, MD; Stryffeler, NP; Tebben, NP o [address], [place], [place] [address] o [phone] o Mon, Tue, Thur, Fri 8:30-5:30, Wed 9:30-5:30, Sat 8:30-12:30 o Babies seen by [facility] providers o Accepting Medicaid o Only accepting infants of first-time parents or siblings of current patients o Hospital discharge coordinator will make follow-up appointment . Jack Amos o [address],  [place], [place]  [address] o [phone]   Fax - [phone] . [facility] o [address], [place], [place]  [address] o Phone - [phone]   Fax - [phone] . Shilpa Gosrani o [address], [place], [place]  [address] o [phone]  [place] ([address]) . [facility] o Bates, MD; Brassfield, MD; Cooper, Cox, MD; MD; Davis, MD; Dovico, MD; Ettefaugh, MD; Little, MD; Lowe, MD; Keiffer, MD; Melvin, MD; Sumner, MD; Williams, MD o [address], [place], [place] [address] o [phone] o Mon-Fri 8:30-5:00 (extended evenings Mon-Thur as needed), Sat-Sun 10:00-1:00 o Providers come to see babies at [facility] o Accepting Medicaid for families of first-time babies and families with all children in the household age 3 and under. Must register with office prior to making appointment (M-F only). . [facility] o Henson, NP; Knapp, MD; Lalonde, MD; Tysinger, PA o [address] [place], [place] [address] o [phone] o Mon-Fri 8:00-5:00 o Babies seen by providers at [facility] o Does NOT accept Medicaid/Commercial Insurance Only . [facility] ([facility])  o Artis, MD; Barnes, MD; Bratton, MD; Coccaro, MD; Lockett Gardner, MD; Kramer, MD; Marshall, MD; Netherton, MD; Poleto, MD; Skinner, MD o [address] [place], [place] [address] o [phone] o Mon-Fri 8:30-5:30, Sat (Oct.-Mar.) 9:00-1:00 o Babies seen by providers at [facility] o Accepting Medicaid  [place] ([address]) . [facility] o Reid, MD; Warner, MD o [address], [place], [place] [address] o [phone] o Mon-Fri 8:30-5:00, Sat 8:30-12:00 o Providers come to see babies at [facility] o Does NOT accept Medicaid . [facility] o Becker, PA; Hagler, MD; Scifres, PA; Sun, MD; Swayne, MD o [address],  [place], [place] [address] o [phone] o Mon-Fri 8:00-5:00 o Babies seen by providers at [facility] o Does NOT accept Medicaid o Only accepting babies of parents who are patients o Please call early in hospitalization for appointment (limited availability) . [facility] o Clark, MD; Frye, MD; Kelleher, MD; Mack, NP; Miller, MD; O'Keller, MD; Patterson, NP; Pudlo, MD; Puzio, MD; Thomas, MD; Tucker, MD; Twiselton, MD o [address], [place], [place] [address] o [phone] o Mon-Fri 8:00-5:00, Sat 9:00-12:00 o Providers come to see babies at [facility] o Does NOT accept Medicaid  [place] ([address]) . [facility] o Limited providers accepting new patients: Brake, NP; Wharton, PA o [address], [place], [place] [address] o [phone] o Mon-Fri 8:00-5:00 o Babies seen by providers at [facility] o Does NOT accept Medicaid o Only accepting babies of parents who are patients o Please call early in hospitalization for appointment (limited availability) . [facility] o Gay, MD; Quinlan, MD o [address] [place], [place] [address] o [phone] (press 1 to schedule appointment) o Mon-Fri 8:00-5:00 o Providers come to see babies at [facility] o Does NOT accept Medicaid . [facility] o Mazer, MD o [address] [place], [place] [address] o [phone] o Mon-Fri 8:30-5:00 (lunch 12:30-1:00), extended hours by appointment only Wed 5:00-6:30 o Babies seen by [facility] providers o Accepting Medicaid . [facility] o Banks, MD; Jordan, MD; Koberlein, MD o [address], [place], [place] [address] o [phone] o Mon-Fri 8:00-5:00 o Babies seen by [facility] providers o Does NOT accept Medicaid . [facility] o Parker, MD; Hunter, MD; Wallace, DO o [address] [place], [place]  [address] o [phone] o Mon-Fri 8:00-5:00 o Babies seen by [facility] providers o Does NOT accept Medicaid . [facility] o Brandon, PA; Brecken, PA; Christy, NP; Dees, MD; DeClaire, MD; DeWeese, MD; Hansen, NP; Mills, NP; Parrish, NP; Smoot, NP; Summer, MD; Vapne, MD o [address] [place], [place] [address] o [phone] o Mon-Fri 8:30-5:00, Sat 10:00-1:00 o Providers come to see babies at [facility] o Does NOT accept Medicaid o Free prenatal information session Tuesdays at 4:45pm . [facility] o Bouska, MD; Gordon, PA; Jeffery, PA; Weber, PA o [address] [place] [address] o [phone] o Mon-Fri 7:30-5:30 o Babies seen by [facility] providers . [facility] o [address], [place], [place]  [address] o [phone]   Fax - [phone]  [place] ([address] & [address]) . [facility] o Reese, MD o [address] [place], [place] [address] o [phone] o Mon-Thur 8:00-6:00 o Providers come to see babies at [facility] o Accepting Medicaid . [facility] o Anderson, NP; Badger, MD; Beal, PA; Spencer, PA o [address] [place],  [address] o [phone] o Mon-Thur 7:30-7:30, Fri 7:30-4:30 o Babies seen by [facility] providers o Accepting Medicaid . [facility] o Agbuya, MD; Klett, NP; Romgoolam, MD o [address], [place],  [address] o [phone] o Mon-Fri 8:30-5:00, Sat 8:30-12:00 o Providers come to see babies at [facility] o Accepting Medicaid o Must have "Meet & Greet" appointment at office prior to delivery . [facility] o McCord,   MD; Wallace, MD; Wood, MD o [address], [place], [place] [address] o [phone] o Mon-Wed 8:00-6:00, Thur-Fri 8:00-5:00, Sat 9:00-12:00 o Providers come to  see babies at [facility] o Does NOT accept Medicaid o Only accepting siblings of current patients . [facility]  o [address], [place], [place]  [address] o [phone]   Fax - [phone] . [facility] o [address], [place], [place]  [address] o [phone]   Fax - [phone]  [place] ([address]) . [facility] o Cirigliano, DO; Matthews, DO o [address] [place], [place] [address] o [phone] o Mon-Fri 7:00-5:00 o Babies seen by [facility] providers o Does NOT accept Medicaid . [facility] o Briscoe, MD; Howley, PA; Moreira, PA o [address], [place], [place] [address] o [phone] o Mon-Fri 8:00-5:00 o Babies seen by [facility] providers o Accepting Medicaid . [facility] o Boyd, MD; Church, PA; Jones, NP; Osborn, PA o [address], [place], [place] [address] o [phone] o Mon-Fri 8:00-5:00 o Babies seen by providers at [facility] o Accepting Medicaid  [place] ([address]) . [facility] [place] o Wendling, DO o [address] [place], [place] [address] o [phone] o Mon-Fri 8:00-5:00 o Babies seen by [facility] providers o Does NOT accept Medicaid o Limited availability, please call early in hospitalization to schedule follow-up . [facility] o Calderon, PA; Cummings, MD; Dillard, MD; Martin, PA; Olson, MD; VanDeven, PA o [address], [place], [place] [address] o [phone] o Mon-Fri 8:30-5:00, Sat 9:00-12:00 o Babies seen by providers at [facility] o Accepting Medicaid o Please register online then schedule online or call office o [email or web address] . [facility] o Hunter, NP; Kumar, MD; Martin Rogers, PA o [address], [place], [place] [address] o [phone] o Mon-Fri 8:00-5:00 o Babies seen by providers at [facility] o Accepting Medicaid . [facility] ([facility] o [place], MD; Kristi Fleenor, NP; West, MD o [address], [place], [place] [address] o [phone] o Mon-Fri 8:00-5:30, Sat&Sun by appointment (phones open at 8:30) o Babies seen by [facility] providers o Accepting Medicaid o Must be a first-time baby or sibling of current patient . [facility] [place]  o [address], [place], [place]  [address] o [phone]   Fax - [phone]  [place] ([address]) . [facility] o Brown, PA; Cowen, PA; Rice, MD; Helton, PA; Spry, MD o [address] [place], [place] [address] o [phone] o Mon-Thur 8:00-7:00, Fri 8:00-5:00, Sat 8:00-12:00, Sun 9:00-12:00 o Babies seen by [facility] providers o Accepting Medicaid . [facility] o Coe-Goins, MD; [place], MD; Pierre-Louis, MD o [address] [place] [address], [place], [place] [address] o [phone] o Mon-Thur 8:00-5:00 o Babies seen by providers at [facility] o Accepting Medicaid . [facility] o Bratton, MD; Coe-Goins, MD; Hayes, MD; Lewis, MD; List, MD; Lott, MD; [place], MD; Moran, MD; O'Kayon Dozier, MD; Pierre-Louis, MD; Pitonzo, MD; Scholer, MD; Spangle, MD o [address] [place], [place]   [address] o [phone] o Mon-Fri 8:00-5:30, Sat (Oct.-Mar.) 9:00-1:00 o Babies seen by providers at [facility] o Accepting Medicaid o Must fill out new patient packet, available online at [email or web address] . [facility] ([facility] o Friddle, NP; Harris, NP; Kelly, NP; Logan, MD;  Melvin, PA; Poth, MD; Ramadoss, MD; Stanton, NP o [address], [place], [place] [address] o [phone] o Mon-Thur 8:00-5:30, Fri 8:00-5:00 o Babies seen by providers at [facility] o Accepting Medicaid  [place] ([address]) . [facility] o Dixon, PA; Dawson, MD; Pickard, MD; Tapia, PA o [address], [place], [place] [address] o [phone] o Mon-Fri 8:00-5:00 o Babies seen by providers at [facility] o Accepting Medicaid   [place] ([address]) . [facility] [place] o Masneri, DO; Meyers, MD; Nelson, PA o [address], [place], [place] [address] o [phone] o Mon-Fri 8:00-5:00 o Babies seen by providers at [facility] o Does NOT accept Medicaid o Limited appointment availability, please call early in hospitalization  . [facility] [place] o Kunedd, DO; McGowen, MD o [address], [place], [place] [address] o [phone] o Mon-Fri 8:00-5:00 o Babies seen by [facility] providers o Does NOT accept Medicaid . [facility] [place] o Cameron, MD; MacDonald, MD; Michaels, PA; Nayak, MD o [address] [place] [address], [place], [place] [address] o [phone] o Mon-Fri 8:00-5:00 o After hours clinic ([address] [place], [place] [address]) [phone] Mon-Fri 5:00-8:00, Sat 12:00-6:00, Sun 10:00-4:00 o Babies seen by [facility] providers o Accepting Medicaid . [facility] [place] o [address], [place], [place]  [address] o [phone]   Fax - [phone]  [place] ([address]) . [facility] [place] [facility] o Andy, MD o [address], [place], [place] [address] o [phone] o Mon-Fri 8:00-5:00 o Babies seen by [facility] providers o Does NOT accept Medicaid . [facility] [place] ([facility] [place]) o Eksir, MD o [address], [place], [place]  [address] o [phone] o Mon-Thur 8:00-7:00, Fri 8:00-5:00, Sat 8:00-12:00 o Babies seen by providers at [facility] o Accepting Medicaid - but does not have vaccinations in office (must be received elsewhere) o Limited availability, please call early in hospitalization  [place] ([address]) . [facility]  o Charlene Flemming, MD o [address], [place] [address] o [phone]  Fax [phone] Places to have your son circumcised:                                                                      [facility]     [phone]   $480 while you are in hospital         [facility]              [phone]   $269 by 4 wks                      [facility]                     [phone]   $  269 by 7 days [facility]                    [phone]   $269 by 4 wks [facility]             [phone]   $175 by 2 wks    These prices sometimes change but are roughly what you can expect to pay. Please call and confirm pricing.   Circumcision is considered an elective/non-medically necessary procedure. There are many reasons parents decide to have their sons circumsized. During the first year of life circumcised males have a reduced risk of urinary tract infections but after this year the rates between circumcised males and uncircumcised males are the same.  It is safe to have your son circumcised outside of the hospital and the places above perform them regularly.   Deciding about Circumcision in Baby Boys  (Up-to-date The Basics)  What is circumcision?   Circumcision is a surgery that removes the skin that covers the tip of the penis, called the "foreskin" Circumcision is usually done when a boy is between 1 and 10 days old. In the [place], circumcision is common. In some other countries, fewer boys are circumcised. Circumcision is a common tradition in some religions.  Should I have my baby boy circumcised?   There is no easy answer. Circumcision has some benefits. But it also has risks.  After talking with your doctor, you will have to decide for yourself what is right for your family.  What are the benefits of circumcision?   Circumcised boys seem to have slightly lower rates of: ?Urinary tract infections ?Swelling of the opening at the tip of the penis Circumcised men seem to have slightly lower rates of: ?Urinary tract infections ?Swelling of the opening at the tip of the penis ?Penis cancer ?HIV and other infections that you catch during sex ?Cervical cancer in the women they have sex with Even so, in the [place], the risks of these problems are small - even in boys and men who have not been circumcised. Plus, boys and men who are not circumcised can reduce these extra risks by: ?Cleaning their penis well ?Using condoms during sex  What are the risks of circumcision?  Risks include: ?Bleeding or infection from the surgery ?Damage to or amputation of the penis ?A chance that the doctor will cut off too much or not enough of the foreskin ?A chance that sex won't feel as good later in life Only about 1 out of every 200 circumcisions leads to problems. There is also a chance that your health insurance won't pay for circumcision.  How is circumcision done in baby boys?  First, the baby gets medicine for pain relief. This might be a cream on the skin or a shot into the base of the penis. Next, the doctor cleans the baby's penis well. Then he or she uses special tools to cut off the foreskin. Finally, the doctor wraps a bandage (called gauze) around the baby's penis. If you have your baby circumcised, his doctor or nurse will give you instructions on how to care for him after the surgery. It is important that you follow those instructions carefully.  

## 2019-01-01 LAB — CERVICOVAGINAL ANCILLARY ONLY
Chlamydia: NEGATIVE
Neisseria Gonorrhea: NEGATIVE

## 2019-01-01 NOTE — Assessment & Plan Note (Signed)
Well controlled; she has not had to use inhaler in a "long time".

## 2019-01-02 LAB — URINE CULTURE, OB REFLEX

## 2019-01-02 LAB — CULTURE, OB URINE

## 2019-01-08 LAB — OBSTETRIC PANEL, INCLUDING HIV
Antibody Screen: NEGATIVE
Basophils Absolute: 0 10*3/uL (ref 0.0–0.2)
Basos: 0 %
EOS (ABSOLUTE): 0.2 10*3/uL (ref 0.0–0.4)
Eos: 2 %
HIV Screen 4th Generation wRfx: NONREACTIVE
Hematocrit: 36.8 % (ref 34.0–46.6)
Hemoglobin: 12 g/dL (ref 11.1–15.9)
Hepatitis B Surface Ag: NEGATIVE
Immature Grans (Abs): 0 10*3/uL (ref 0.0–0.1)
Immature Granulocytes: 0 %
Lymphocytes Absolute: 2.1 10*3/uL (ref 0.7–3.1)
Lymphs: 24 %
MCH: 24.6 pg — ABNORMAL LOW (ref 26.6–33.0)
MCHC: 32.6 g/dL (ref 31.5–35.7)
MCV: 75 fL — ABNORMAL LOW (ref 79–97)
Monocytes Absolute: 0.4 10*3/uL (ref 0.1–0.9)
Monocytes: 5 %
Neutrophils Absolute: 6 10*3/uL (ref 1.4–7.0)
Neutrophils: 69 %
Platelets: 246 10*3/uL (ref 150–450)
RBC: 4.88 x10E6/uL (ref 3.77–5.28)
RDW: 15.1 % (ref 11.7–15.4)
RPR Ser Ql: NONREACTIVE
Rh Factor: NEGATIVE
Rubella Antibodies, IGG: 2.08 index (ref >0.99)
WBC: 8.8 10*3/uL (ref 3.4–10.8)

## 2019-01-08 LAB — HEMOGLOBINOPATHY EVALUATION
Ferritin: 31 ng/mL (ref 15–150)
Hgb A2 Quant: 2.2 % (ref 1.8–3.2)
Hgb A: 97.8 % (ref 96.4–98.8)
Hgb C: 0 %
Hgb F Quant: 0 % (ref 0.0–2.0)
Hgb S: 0 %
Hgb Solubility: NEGATIVE
Hgb Variant: 0 %

## 2019-01-08 LAB — ALPHA-THALASSEMIA

## 2019-01-08 NOTE — Telephone Encounter (Signed)
Opened in error

## 2019-01-09 ENCOUNTER — Encounter: Payer: Self-pay | Admitting: Student

## 2019-01-09 DIAGNOSIS — D563 Thalassemia minor: Secondary | ICD-10-CM | POA: Insufficient documentation

## 2019-01-21 ENCOUNTER — Telehealth: Payer: Self-pay | Admitting: Family Medicine

## 2019-01-21 NOTE — Telephone Encounter (Signed)
Called patient to get her scheduled for her genetic testing appointment. Patient stated that she would like to come in on Thursday or Friday. Patient was scheduled for 4/14 @ 10:30. Patient informed that no visitors are allowed with her and she is required to wear a mask for her appointment. Patient verbalized understanding.

## 2019-01-23 ENCOUNTER — Other Ambulatory Visit: Payer: Medicaid Other

## 2019-01-23 ENCOUNTER — Other Ambulatory Visit: Payer: Self-pay

## 2019-01-23 DIAGNOSIS — Z3492 Encounter for supervision of normal pregnancy, unspecified, second trimester: Secondary | ICD-10-CM

## 2019-01-29 ENCOUNTER — Other Ambulatory Visit: Payer: Self-pay | Admitting: Student

## 2019-01-29 ENCOUNTER — Other Ambulatory Visit: Payer: Self-pay

## 2019-01-29 ENCOUNTER — Ambulatory Visit (HOSPITAL_COMMUNITY)
Admission: RE | Admit: 2019-01-29 | Discharge: 2019-01-29 | Disposition: A | Discharge status: Home / Self Care | Payer: Medicaid Other | Source: Ambulatory Visit | Attending: Obstetrics and Gynecology | Admitting: Obstetrics and Gynecology

## 2019-01-29 DIAGNOSIS — Z3492 Encounter for supervision of normal pregnancy, unspecified, second trimester: Secondary | ICD-10-CM | POA: Diagnosis not present

## 2019-01-29 DIAGNOSIS — O99212 Obesity complicating pregnancy, second trimester: Secondary | ICD-10-CM

## 2019-01-29 DIAGNOSIS — Z3A19 19 weeks gestation of pregnancy: Secondary | ICD-10-CM | POA: Diagnosis not present

## 2019-01-29 DIAGNOSIS — Z363 Encounter for antenatal screening for malformations: Secondary | ICD-10-CM

## 2019-01-30 ENCOUNTER — Other Ambulatory Visit (HOSPITAL_COMMUNITY): Payer: Self-pay | Admitting: Maternal & Fetal Medicine

## 2019-01-30 DIAGNOSIS — Z3A23 23 weeks gestation of pregnancy: Secondary | ICD-10-CM

## 2019-01-30 DIAGNOSIS — Z0489 Encounter for examination and observation for other specified reasons: Secondary | ICD-10-CM

## 2019-01-30 DIAGNOSIS — IMO0002 Reserved for concepts with insufficient information to code with codable children: Secondary | ICD-10-CM

## 2019-01-31 ENCOUNTER — Telehealth: Payer: Self-pay | Admitting: Obstetrics & Gynecology

## 2019-01-31 NOTE — Telephone Encounter (Signed)
Called the patient to confirm the appointment and how to access the mychart visit. The patient verbalized understanding.

## 2019-02-04 ENCOUNTER — Other Ambulatory Visit: Payer: Self-pay

## 2019-02-04 ENCOUNTER — Telehealth (INDEPENDENT_AMBULATORY_CARE_PROVIDER_SITE_OTHER): Payer: Medicaid Other | Admitting: Student

## 2019-02-04 VITALS — BP 120/78 | HR 95

## 2019-02-04 DIAGNOSIS — Z3492 Encounter for supervision of normal pregnancy, unspecified, second trimester: Secondary | ICD-10-CM

## 2019-02-04 DIAGNOSIS — Z3A19 19 weeks gestation of pregnancy: Secondary | ICD-10-CM

## 2019-02-04 NOTE — Progress Notes (Addendum)
Patient ID: Crystal May, female   DOB: 12-28-1995, 22 y.o.   MRN: [id number] I connected with@ on 02/04/19 at  9:35 AM EDT by: Mychart and verified that I am speaking with the correct person using two identifiers.  Patient is located at [facility] provider is located at [place]   The purpose of this virtual visit is to provide medical care while limiting exposure to the novel coronavirus. I discussed the limitations, risks, security and privacy concerns of performing an evaluation and management service by My Chart and the availability of in person appointments. I also discussed with the patient that there may be a patient responsible charge related to this service. By engaging in this virtual visit, you consent to the provision of healthcare.  Additionally, you authorize for your insurance to be billed for the services provided during this visit.  The patient expressed understanding and agreed to proceed.  The following staff members participated in the virtual visit:  [place] Nunnery    PRENATAL VISIT NOTE  Subjective:  Deavion Oliveros is a 23 y.o. G2P0010 at [redacted]w[redacted]d  for phone visit for ongoing prenatal care.  She is currently monitored for the following issues for this low-risk pregnancy and has Supervision of low-risk pregnancy; Asthma affecting pregnancy, antepartum; History of miscarriage, currently pregnant; Rh negative state in antepartum period; and Alpha thalassemia trait on their problem list.  Patient reports no complaints.  Contractions: Not present. Vag. Bleeding: None.  Movement: Present. Denies leaking of fluid.   The following portions of the patient's history were reviewed and updated as appropriate: allergies, current medications, past family history, past medical history, past social history, past surgical history and problem list.   Objective:   Vitals:   02/04/19 0955  BP: 120/78  Pulse: 95   Self-Obtained  Fetal Status:     Movement: Present     Assessment and Plan:   Pregnancy: G2P0010 at 43w6dPreterm labor symptoms and general obstetric precautions including but not limited to vaginal bleeding, contractions, leaking of fluid and fetal movement were reviewed in detail with the patient. 1. Encounter for supervision of low-risk pregnancy in second trimester   -discussed birth control, patient still undecided -discussed protocol for 28 week labs, fasting, etc.  -Reviewed BPs in [facility]; all normal. Patient feels comfortable taking her BP and will continue to do weekly.  Return in about 8 weeks (around 04/01/2019), or in person visit with 28 weeks.  Future Appointments  Date Time Provider Department Center  02/26/2019  9:15 AM [facility] [place] 4 WH-[facility]US [facility]-US     Time spent on virtual visit: 20 minutes  Marylene Land, CNM

## 2019-02-04 NOTE — Patient Instructions (Signed)
Glucose Tolerance Test Why am I having this test? The glucose tolerance test (GTT) is done to check how your body processes sugar (glucose). This is one of several tests used to diagnose diabetes (diabetes mellitus). Your health care provider may recommend this test if you:  Have a family history of diabetes.  Are very overweight (obese).  Have infections that keep coming back (recurring).  Have had a lot of wounds that did not heal quickly, especially on your legs and feet.  Are a woman and have a history of giving birth to very large babies or a history of repeated fetal loss (stillbirth).  Have had high glucose levels in your urine or blood: ? During a past pregnancy. ? After a heart attack, surgery, or prolonged periods of high stress. What is being tested? This test measures the amount of glucose in your blood at different times during a period of 2 hours. This indicates how well your body is able to process glucose. What kind of sample is taken?  Blood samples are required for this test. They are usually collected by inserting a needle into a blood vessel. How do I prepare for this test?  For 3 days before your test, eat normally. Have plenty of carbohydrate-rich foods.  Follow instructions from your health care provider about: ? Eating or drinking restrictions on the day of the test. You may be asked to not eat or drink anything other than water (fast) starting 8-12 hours before the test. ? Changing or stopping your regular medicines. Some medicines may interfere with this test. Tell a health care provider about:  All medicines you are taking, including vitamins, herbs, eye drops, creams, and over-the-counter medicines.  Any blood disorders you have.  Any surgeries you have had.  Any medical conditions you have.  Whether you are pregnant or may be pregnant. What happens during the test? First, your blood glucose will be measured. This is referred to as your fasting  blood glucose, since you fasted before the test. Then, you will drink a glucose solution that contains a certain amount of glucose. Your blood glucose will be measured again 1 and 2 hours after drinking the solution. This test takes 2 hours to complete. You will need to stay at the testing location during this time. During the testing period:  Do not eat or drink anything other than the glucose solution. You will be allowed to drink water.  Do not exercise.  Do not use any products that contain nicotine or tobacco, such as cigarettes and e-cigarettes. If you need help stopping, ask your health care provider. The testing procedure may vary among health care providers and hospitals. How are the results reported? Your results will be reported as milligrams of glucose per deciliter of blood (mg/dL) or millimoles per liter (mmol/L). Your health care provider will compare your results to normal ranges that were established after testing a large group of people (reference ranges). Reference ranges may vary among labs and hospitals. For this test, common reference ranges are:  Fasting: less than 110 mg/dL (6.1 mmol/L).  1 hour after drinking glucose: less than 180 mg/dL (10.0 mmol/L).  2 hours after drinking glucose: less than 140 mg/dL (7.8 mmol/L). What do the results mean? Results that are within the reference ranges are considered normal, meaning that your glucose levels are well controlled. Results higher than the reference ranges may mean that you recently experienced stress, such as from an injury or a sudden (acute) condition like a  heart attack or stroke, or that you have:  Diabetes.  Cushing syndrome.  Tumors such as pheochromocytoma or glucagonoma.  Kidney failure.  Pancreatitis.  Hyperthyroidism.  An infection. Talk with your health care provider about what your results mean. Questions to ask your health care provider Ask your health care provider, or the department that is  doing the test:  When will my results be ready?  How will I get my results?  What are my treatment options?  What other tests do I need?  What are my next steps? Summary  The glucose tolerance test (GTT) is done to check how your body processes sugar (glucose). This is one of several tests used to diagnose diabetes (diabetes mellitus).  This test measures the amount of glucose in your blood at different times during a period of 2 hours. This indicates how well your body is able to process glucose.  Talk with your health care provider about what your results mean. This information is not intended to replace advice given to you by your health care provider. Make sure you discuss any questions you have with your health care provider. Document Released: 09/20/2004 Document Revised: 04/09/2017 Document Reviewed: 04/09/2017 Elsevier Interactive Patient Education  2019 [employer].

## 2019-02-05 ENCOUNTER — Encounter: Payer: Self-pay | Admitting: *Deleted

## 2019-02-06 LAB — INHERITEST(R) CF/SMA PANEL

## 2019-02-08 NOTE — Progress Notes (Signed)
Chart reviewed for nurse visit. Agree with plan of care.   Jacqui Headen Lorraine, CNM 02/08/2019 3:03 PM   

## 2019-02-26 ENCOUNTER — Other Ambulatory Visit (HOSPITAL_COMMUNITY): Payer: Self-pay | Admitting: *Deleted

## 2019-02-26 ENCOUNTER — Other Ambulatory Visit: Payer: Self-pay

## 2019-02-26 ENCOUNTER — Ambulatory Visit (HOSPITAL_COMMUNITY)
Admission: RE | Admit: 2019-02-26 | Discharge: 2019-02-26 | Disposition: A | Discharge status: Home / Self Care | Payer: Medicaid Other | Source: Ambulatory Visit | Attending: Maternal & Fetal Medicine | Admitting: Maternal & Fetal Medicine

## 2019-02-26 DIAGNOSIS — Z3A23 23 weeks gestation of pregnancy: Secondary | ICD-10-CM | POA: Insufficient documentation

## 2019-02-26 DIAGNOSIS — O99212 Obesity complicating pregnancy, second trimester: Secondary | ICD-10-CM | POA: Diagnosis not present

## 2019-02-26 DIAGNOSIS — Z362 Encounter for other antenatal screening follow-up: Secondary | ICD-10-CM

## 2019-02-26 DIAGNOSIS — Z0489 Encounter for examination and observation for other specified reasons: Secondary | ICD-10-CM | POA: Diagnosis not present

## 2019-02-26 DIAGNOSIS — IMO0002 Reserved for concepts with insufficient information to code with codable children: Secondary | ICD-10-CM

## 2019-03-26 ENCOUNTER — Other Ambulatory Visit: Payer: Self-pay | Admitting: *Deleted

## 2019-03-26 DIAGNOSIS — Z3490 Encounter for supervision of normal pregnancy, unspecified, unspecified trimester: Secondary | ICD-10-CM

## 2019-04-01 ENCOUNTER — Ambulatory Visit (INDEPENDENT_AMBULATORY_CARE_PROVIDER_SITE_OTHER): Payer: Medicaid Other | Admitting: Student

## 2019-04-01 ENCOUNTER — Other Ambulatory Visit: Payer: Self-pay

## 2019-04-01 ENCOUNTER — Other Ambulatory Visit: Payer: Medicaid Other

## 2019-04-01 VITALS — BP 122/81 | HR 97 | Wt 202.3 lb

## 2019-04-01 DIAGNOSIS — Z3490 Encounter for supervision of normal pregnancy, unspecified, unspecified trimester: Secondary | ICD-10-CM

## 2019-04-01 DIAGNOSIS — Z23 Encounter for immunization: Secondary | ICD-10-CM

## 2019-04-01 DIAGNOSIS — O360120 Maternal care for anti-D [Rh] antibodies, second trimester, not applicable or unspecified: Secondary | ICD-10-CM

## 2019-04-01 DIAGNOSIS — Z3A27 27 weeks gestation of pregnancy: Secondary | ICD-10-CM

## 2019-04-01 MED ORDER — RHO D IMMUNE GLOBULIN 1500 UNIT/2ML IJ SOSY
300.0000 ug | PREFILLED_SYRINGE | Freq: Once | INTRAMUSCULAR | Status: AC
  Administered 2019-04-01: 300 ug via INTRAMUSCULAR

## 2019-04-01 NOTE — Progress Notes (Addendum)
   PRENATAL VISIT NOTE  Subjective:  Crystal May is a 23 y.o. G2P0010 at [redacted]w[redacted]d being seen today for ongoing prenatal care.  She is currently monitored for the following issues for this low-risk pregnancy and has Supervision of low-risk pregnancy; Asthma affecting pregnancy, antepartum; History of miscarriage, currently pregnant; Rh negative state in antepartum period; and Alpha thalassemia trait on their problem list.  Patient reports no complaints. Has been checking her BPS at home--all normal! .  Contractions: Not present. Vag. Bleeding: None.  Movement: Present. Denies leaking of fluid.   The following portions of the patient's history were reviewed and updated as appropriate: allergies, current medications, past family history, past medical history, past social history, past surgical history and problem list.   Objective:   Vitals:   04/01/19 0833  BP: 122/81  Pulse: 97  Weight: 202 lb 4.8 oz (91.8 kg)    Fetal Status:   Fundal Height: 26 cm Movement: Present     General:  Alert, oriented and cooperative. Patient is in no acute distress.  Skin: Skin is warm and dry. No rash noted.   Cardiovascular: Normal heart rate noted  Respiratory: Normal respiratory effort, no problems with respiration noted  Abdomen: Soft, gravid, appropriate for gestational age.  Pain/Pressure: Absent     Pelvic: Cervical exam deferred        Extremities: Normal range of motion.  Edema: None  Mental Status: Normal mood and affect. Normal behavior. Normal judgment and thought content.   Assessment and Plan:  Pregnancy: G2P0010 at [redacted]w[redacted]d 1. Encounter for supervision of low-risk pregnancy, antepartum -Rhogam given today -2 hour in process -Follow up anatomy scan -Right now she doesn't want any birth control  - Tdap vaccine greater than or equal to 7yo IM  Preterm labor symptoms and general obstetric precautions including but not limited to vaginal bleeding, contractions, leaking of fluid and fetal  movement were reviewed in detail with the patient. Please refer to After Visit Summary for other counseling recommendations.   Return in about 4 weeks (around 04/29/2019), or [facility] My chart visit.  Future Appointments  Date Time Provider [place]  04/09/2019  8:45 AM Emily [place] 2 [id number]  04/29/2019  8:55 AM Wende Mott, [facility]    [place], [place]

## 2019-04-01 NOTE — Patient Instructions (Addendum)
AREA PEDIATRIC/FAMILY PRACTICE PHYSICIANS  [place] ([address]) . [facility] o Chambliss, MD; Eniola, MD; Hale, MD; Hensel, MD; McDiarmid, MD; McIntyer, MD; Neal, MD; Walden, MD o [address] [place], [place] [address] o [phone] o Mon-Fri 8:30-12:30, 1:30-5:00 o Providers come to see babies at [facility] o Accepting Medicaid . [facility] o Limited providers who accept newborns: Koirala, MD; Morrow, MD; Wolters, MD o [address], [place], [place] [address] o [phone] o Mon-Fri 8:00-5:30 o Babies seen by providers at [facility] o Does NOT accept Medicaid o Please call early in hospitalization for appointment (limited availability)  . [facility] o [place], MD o [address] [place], [place] [address] o [phone] o Mon, Tue, Thur, Fri 8:30-5:00, Wed 10:00-7:00 (closed 1-2pm) o Babies seen by [facility] providers o Accepting Medicaid . Rubin - Pediatrician o Rubin, MD o [address], [place], [place] [address] o [phone] o Mon-Fri 8:30-5:00, Sat 8:30-12:00 o Provider comes to see babies at [facility] o Accepting Medicaid o Must have been referred from current patients or contacted office prior to delivery . [facility] o Brown, MD; Chandler, MD; Ettefagh, MD; Grant, MD; Lester, MD; McCormick, MD; McQueen, MD; Prose, MD; Simha, MD; Stanley, MD; Stryffeler, NP; Tebben, NP o [address], [place], [place] [address] o [phone] o Mon, Tue, Thur, Fri 8:30-5:30, Wed 9:30-5:30, Sat 8:30-12:30 o Babies seen by [facility] providers o Accepting Medicaid o Only accepting infants of first-time parents or siblings of current patients o Hospital discharge coordinator will make follow-up appointment . Jack Amos o [address],  [place], [place]  [address] o [phone]   Fax - [phone] . [facility] o [address], [place], [place]  [address] o Phone - [phone]   Fax - [phone] . Shilpa Gosrani o [address], [place], [place]  [address] o [phone]  [place] ([address]) . [facility] o Bates, MD; [place]ass[place], MD; Cooper, Cox, MD; MD; Davis, MD; Dovico, MD; Ettefaugh, MD; Little, MD; Lowe, MD; Keiffer, MD; Melvin, MD; Sumner, MD; Williams, MD o [address], [place], [place] [address] o [phone] o Mon-Fri 8:30-5:00 (extended evenings Mon-Thur as needed), Sat-Sun 10:00-1:00 o Providers come to see babies at [facility] o Accepting Medicaid for families of first-time babies and families with all children in the household age 3 and under. Must register with office prior to making appointment (M-F only). . [facility] o Henson, NP; Knapp, MD; Lalonde, MD; Tysinger, PA o [address] [place], [place] [address] o [phone] o Mon-Fri 8:00-5:00 o Babies seen by providers at [facility] o Does NOT accept Medicaid/Commercial Insurance Only . [facility] ([facility]  o Artis, MD; Barnes, MD; Bratton, MD; Coccaro, MD; Lockett Gardner, MD; Kramer, MD; Marshall, MD; Netherton, MD; Poleto, MD; Skinner, MD o [address] [place], [place] [address] o [phone] o Mon-Fri 8:30-5:30, Sat (Oct.-Mar.) 9:00-1:00 o Babies seen by providers at [facility] o Accepting Medicaid  [place] ([address]) . [facility] o Reid, MD; Warner, MD o [address], [place],  [address] o [phone] o Mon-Fri 8:30-5:00, Sat 8:30-12:00 o Providers come to see babies at [facility] o Does NOT accept Medicaid . [facility] Ricci Barker, PA; Mannie Stabile, MD; [place], [place]; Nancy Fetter, MD; Moreen Fowler, [place],  [place], [place] [address] o [phone] o Mon-Fri 8:00-5:00 o Babies seen by providers at [facility] o Does NOT accept Medicaid o Only accepting babies of parents who are patients o Please call early in hospitalization for appointment (limited availability) . [facility] Pediatricians Blanca Friend, MD; Sharlene Motts, MD; Rod Can, MD; Warner Mccreedy, NP; Sabra Heck, MD; Ermalinda Memos, MD; Sharlett Iles, NP; Aurther Loft, MD; Jerrye Beavers, MD; Marcello Moores, MD; Berline Lopes, MD; Charolette Forward, MD o [place]. [place], [place], [place] [address] o [phone] o Mon-Fri 8:00-5:00, Sat 9:00-12:00 o Providers come to see babies at [facility] o Does NOT accept [facility] [phone]) . [facility] [place] providers accepting new patients: Dayna Ramus, NP; [place], [place], [place], [place] [address] o [phone] o Mon-Fri 8:00-5:00 o Babies seen by providers at [facility] o Does NOT accept Medicaid o Only accepting babies of parents who are patients o Please call early in hospitalization for appointment (limited availability) . [facility] Oswaldo Conroy, MD; Sheran Lawless, MD o [address] [place], [place] [address] o [phone] (press 1 to schedule appointment) o Mon-Fri 8:00-5:00 o Providers come to see babies at [facility] o Does NOT accept Medicaid . [facility] Jodi Mourning, MD o [address] [place], [place] [address] o [phone] o Mon-Fri 8:30-5:00 (lunch 12:30-1:00), extended hours by appointment only Wed 5:00-6:30 o Babies seen by [facility] providers o Accepting Medicaid . [place] at Evalyn Casco, MD; [place], MD; Ethlyn Gallery, MD o [place], [place], [place] [address] o [phone] o Mon-Fri 8:00-5:00 o Babies seen by [facility] providers o Does NOT accept Medicaid . Therapist, music at [place], MD; Yong Channel, MD; [place], [place] [address] [place], [place] [address] o  [phone] o Mon-Fri 8:00-5:00 o Babies seen by [facility] providers o Does NOT accept Medicaid . [place], [place]; [place], [place]; [place], NP; Albertina Parr, MD; Frederic Jericho, MD; Ronney Lion, MD; Carlos Levering, NP; Jerelene Redden, NP; Tomasita Crumble, NP; Ronelle Nigh, NP; Corinna Lines, MD; [place], MD o [address] [place], [place] [address] o [phone] o Mon-Fri 8:30-5:00, Sat 10:00-1:00 o Providers come to see babies at [facility] o Does NOT accept Medicaid o Free prenatal information session Tuesdays at 4:45pm . [facility] Porfirio Oar, MD; [place], [place]; [place], [place]; [place], [place]., [place] [address] o [phone] o Mon-Fri 7:30-5:30 o Babies seen by [facility] providers . [facility] Children[facility] Doctor o [address], [place], [place], [place]  [address] o [phone]   Fax - [phone]  [place] [phone] & [phone]) . [place], MD o [address] [place], [place] [address] o [phone] o Mon-Thur 8:00-6:00 o Providers come to see babies at [place] Medicaid . [place], NP; Melford Aase, MD; [place], [place]; [place], [address] [place], [place] [address] o [phone] o Mon-Thur 7:30-7:30, Fri 7:30-4:30 o Babies seen by [facility] providers o Accepting Medicaid . [facility] Nyra Jabs, MD; Cristino Martes, NP; Gertie Baron, MD o [place] [address], [place], [place] [address] o [phone] o Mon-Fri 8:30-5:00, Sat 8:30-12:00 o Providers come to see babies at [place] Medicaid o Must have "Meet & Greet" appointment at office prior to delivery . [place] ([place]) o Hubbard,  MD; Juleen China, MD; Clydene Laming, [place] [address], [place], [place] [address] o [phone] o Mon-Wed 8:00-6:00, Thur-Fri 8:00-5:00, Sat 9:00-12:00 o Providers come to see  babies at [facility] o Does NOT accept Medicaid o Only accepting siblings of current patients . [facility], [place], [place], [place]  [address] o [phone]   Fax [phone] . [place] at [place] [address], [place], [place]  [address] o [phone]   Fax - Artois Craig [phone] & [phone]) . Therapist, music at [place], DO; [place], [address] [place], [place] [address] o [phone] o Mon-Fri 7:00-5:00 o Babies seen by [facility] providers o Does NOT accept Medicaid . [place], MD; [place], [place]; [place], [place], [place], [place] [address] o [phone] o Mon-Fri 8:00-5:00 o Babies seen by [facility] providers o Accepting Medicaid . Reed, MD; [place], [place]; [place], NP; [place], [place], [place], [place] [address] o [phone] o Mon-Fri 8:00-5:00 o Babies seen by providers at [facility] [phone]) . [facility] [place], [place] o [address] [place], [place] [address] o [phone] o Mon-Fri 8:00-5:00 o Babies seen by [facility] providers o Does NOT accept Medicaid o Limited availability, please call early in hospitalization to schedule follow-up . [facility] Leilani Merl, PA; Maisie Fus, MD; [place], MD; [place], [place]; Jeannine Kitten, MD; [place], [place]y [facility] [address], [place], [place] [address] o [phone] o Mon-Fri 8:30-5:00, Sat 9:00-12:00 o Babies seen by providers at [facility] o Accepting Medicaid o Please register online then schedule online or call office o [email or web address] . [place] [facility] [facility] [place]) Kristian Covey, NP; Dwyane Dee, MD; Leonidas Romberg, PA o [address] [place], [place], [place] [address] o [phone] o Mon-Fri 8:00-5:00 o Babies seen by providers at [facility] o Accepting Medicaid . [place] ([facility]) Dairl Ponder, MD; Rayvon Char, NP; Melina Modena, MD o [address] [place], [place], [place] [address] o [phone] o Mon-Fri 8:00-5:30, Sat&Sun by appointment (phones open at 8:30) o Babies seen by [facility] providers o Accepting Medicaid o Must be a first-time baby or sibling of current patient . [place], [address], [place], [place]  [address] o [phone]   Fax - [phone]  [place] [phone] & [phone]) . [place], [place]; [place], [place]; Benjamine Mola, MD; [place], [place]; Harrell Lark, MD o [address], [place], [place] [address] o [phone] o Mon-Thur 8:00-7:00, Fri 8:00-5:00, Sat 8:00-12:00, Sun 9:00-12:00 o Babies seen by [facility] providers o Accepting Medicaid . [facility], MD; Ruthann Cancer, MD; [facility], MD o [address], [place],  [address] o [phone] o Mon-Thur 8:00-5:00 o Babies seen by providers at [facility] o Accepting Medicaid . [facility] [place], MD; Coe-Goins, MD; Amedeo Plenty, MD; Bobby Rumpf, MD; List, MD; Lavonia Drafts, MD; Ruthann Cancer, MD; Selinda Eon, MD; Audie Box, MD; Jim Like, MD; Christie Nottingham, MD; [place], MD; Modena Nunnery, MD o [address] [place], [place]  [address] o [phone] o Mon-Fri 8:00-5:30, Sat (Oct.-Mar.) 9:00-1:00 o Babies seen by providers at [facility] o Accepting Medicaid o Must fill out new patient packet, available online at [email or web address] . [place] ([facility]) Barnabas Lister, NP; [place]on Kingfisher, NP; Claiborne Billings, NP; Rolla Plate, MD; [place], [place];  Carola Rhine, MD; Tyron Russell, MD; Delia Chimes, NP o [address], [place], [place] [address] o [phone] o Mon-Thur 8:00-5:30, Fri 8:00-5:00 o Babies seen by providers at [place] [phone]) . [place], [place]; Troy, MD; Dennard Schaumann, MD; [place], [place] o [address] [place], [place] [address] o [phone] o Mon-Fri 8:00-5:00 o Babies seen by providers at [facility] [phone]) . [place] at [place], [place]; Olen Pel, MD; [place], [place], [place], [place] [address] o [phone] o Mon-Fri 8:00-5:00 o Babies seen by providers at [facility] o Does NOT accept Medicaid o Limited appointment availability, please call early in hospitalization  . Therapist, music at [place], [place]; [place], [address] [place], [place] [address] o [phone] o Mon-Fri 8:00-5:00 o Babies seen by [facility] providers o Does NOT accept Medicaid . [facility] Su Grand, MD; Guy Sandifer, MD; [place], [place]; [place], [place] [address], [place], [place] [address] o [phone] o Mon-Fri 8:00-5:00 o After hours clinic [facility] [place], [place] [address]) [phone] Mon-Fri 5:00-8:00, Sat 12:00-6:00, Sun 10:00-4:00 o Babies seen by [facility] providers o Accepting Medicaid . [place] [facility] o [address], [place], [place]  [address] o [phone]   Fax - [phone]  [place] [phone]) . Therapist, music at [facility], MD o [address] [place], [place], [place] [address] o [phone] o Mon-Fri 8:00-5:00 o Babies seen by [facility] providers o Does NOT accept Medicaid . [place] ([place] [facility] [place]) Bing Neighbors, MD o [address] [place], [place],  [address] o [phone] o  Mon-Thur 8:00-7:00, Fri 8:00-5:00, Sat 8:00-12:00 o Babies seen by providers at [facility] o Accepting Medicaid - but does not have vaccinations in office (must be received elsewhere) o Limited availability, please call early in hospitalization  [place] ([address]) . [place], MD o [address] [place] [address] o [phone]  Fax [phone]    Contraception Choices Contraception, also called birth control, means things to use or ways to try not to get pregnant. Hormonal birth control This kind of birth control uses hormones. Here are some types of hormonal birth control:  A tube that is put under skin of the arm (implant). The tube can stay in for as long as 3 years.  Shots to get every 3 months (injections).  Pills to take every day (birth control pills).  A patch to change 1 time each week for 3 weeks (birth control patch). After that, the patch is taken off for 1 week.  A ring to put in the vagina. The ring is left in for 3 weeks. Then it is taken out of the vagina for 1 week. Then a new ring is put in.  Pills to take after unprotected sex (emergency birth control pills). Barrier birth control Here are some types of barrier birth control:  A  covering that is put on the penis before sex (female condom). The covering is thrown away after sex.  A soft, loose covering that is put in the vagina before sex (female condom). The covering is thrown away after sex.  A rubber bowl that sits over the cervix (diaphragm). The bowl must be made for you. The bowl is put into the vagina before sex. The bowl is left in for 6-8 hours after sex. It is taken out within 24 hours.  A small, soft cup that fits over the cervix (cervical cap). The cup must be made for you. The cup can be left in for 6-8 hours after sex. It is taken out within 48 hours.  A sponge that is put into the vagina before sex. It must be left in for at least 6 hours  after sex. It must be taken out within 30 hours. Then it is thrown away.  A chemical that kills or stops sperm from getting into the uterus (spermicide). It may be a pill, cream, jelly, or foam to put in the vagina. The chemical should be used at least 10-15 minutes before sex. IUD (intrauterine) birth control An IUD is a small, T-shaped piece of plastic. It is put inside the uterus. There are two kinds:  Hormone IUD. This kind can stay in for 3-5 years.  Copper IUD. This kind can stay in for 10 years. Permanent birth control Here are some types of permanent birth control:  Surgery to block the fallopian tubes.  Having an insert put into each fallopian tube.  Surgery to tie off the tubes that carry sperm (vasectomy). Natural planning birth control Here are some types of natural planning birth control:  Not having sex on the days the woman could get pregnant.  Using a calendar: ? To keep track of the length of each period. ? To find out what days pregnancy can happen. ? To plan to not have sex on days when pregnancy can happen.  Watching for symptoms of ovulation and not having sex during ovulation. One way the woman can check for ovulation is to check her temperature.  Waiting to have sex until after ovulation. Summary  Contraception, also called birth control, means things to use or ways to try not to get pregnant.  Hormonal methods of birth control include implants, injections, pills, patches, vaginal rings, and emergency birth control pills.  Barrier methods of birth control can include female condoms, female condoms, diaphragms, cervical caps, sponges, and spermicides.  There are two types of IUD (intrauterine device) birth control. An IUD can be put in a woman's uterus to prevent pregnancy for 3-5 years.  Permanent sterilization can be done through a procedure for males, females, or both.  Natural planning methods involve not having sex on the days when the woman could  get pregnant. This information is not intended to replace advice given to you by your health care provider. Make sure you discuss any questions you have with your health care provider. Document Released: 06/25/2009 Document Revised: 12/18/2018 Document Reviewed: 09/07/2016 Elsevier Patient Education  2020 Elsevier Inc.  

## 2019-04-02 LAB — RPR: RPR Ser Ql: NONREACTIVE

## 2019-04-02 LAB — GLUCOSE TOLERANCE, 2 HOURS W/ 1HR
Glucose, 1 hour: 212 mg/dL — ABNORMAL HIGH (ref 65–179)
Glucose, 2 hour: 145 mg/dL (ref 65–152)
Glucose, Fasting: 114 mg/dL — ABNORMAL HIGH (ref 65–91)

## 2019-04-02 LAB — CBC
Hematocrit: 33.6 % — ABNORMAL LOW (ref 34.0–46.6)
Hemoglobin: 10.7 g/dL — ABNORMAL LOW (ref 11.1–15.9)
MCH: 24.3 pg — ABNORMAL LOW (ref 26.6–33.0)
MCHC: 31.8 g/dL (ref 31.5–35.7)
MCV: 76 fL — ABNORMAL LOW (ref 79–97)
Platelets: 214 10*3/uL (ref 150–450)
RBC: 4.4 x10E6/uL (ref 3.77–5.28)
RDW: 15 % (ref 11.7–15.4)
WBC: 8.5 10*3/uL (ref 3.4–10.8)

## 2019-04-02 LAB — HIV ANTIBODY (ROUTINE TESTING W REFLEX): HIV Screen 4th Generation wRfx: NONREACTIVE

## 2019-04-02 LAB — ANTIBODY SCREEN: Antibody Screen: NEGATIVE

## 2019-04-04 ENCOUNTER — Encounter: Payer: Self-pay | Admitting: Student

## 2019-04-04 DIAGNOSIS — O24419 Gestational diabetes mellitus in pregnancy, unspecified control: Secondary | ICD-10-CM | POA: Insufficient documentation

## 2019-04-07 ENCOUNTER — Telehealth: Payer: Self-pay | Admitting: Lactation Services

## 2019-04-07 DIAGNOSIS — O24410 Gestational diabetes mellitus in pregnancy, diet controlled: Secondary | ICD-10-CM

## 2019-04-07 MED ORDER — ACCU-CHEK FASTCLIX LANCETS MISC: 1.0000 | Freq: Four times a day (QID) | 12 refills | Status: DC

## 2019-04-07 MED ORDER — ACCU-CHEK GUIDE W/DEVICE KIT: 1.0000 | PACK | Freq: Once | 0 refills | Status: AC

## 2019-04-07 MED ORDER — ACCU-CHEK GUIDE VI STRP: ORAL_STRIP | 12 refills | Status: DC

## 2019-04-07 NOTE — Telephone Encounter (Signed)
-----   Message from [place], [place] sent at 04/04/2019  7:20 PM EDT ----- Patient has Gestational diabetes; she will need to be set up with supplies, testing, and an appt with Bev.

## 2019-04-07 NOTE — Telephone Encounter (Signed)
Called to inform pt that her GTT came up positive for GDM. Reviewed that she will be checking her blood sugar levels 4 x a day and will check a fasting level and 2 hours after breakfast, lunch and dinner.   Informed pt that Glucometer, test strips and lancets were sent into her pharmacy and she can pick up later today. Advised to bring in when she has her appt with Diabetes Education. Pt voiced understanding.   Pt reports she has no questions/concerns at this time.

## 2019-04-09 ENCOUNTER — Other Ambulatory Visit (HOSPITAL_COMMUNITY): Payer: Self-pay | Admitting: *Deleted

## 2019-04-09 ENCOUNTER — Ambulatory Visit (HOSPITAL_COMMUNITY)
Admission: RE | Admit: 2019-04-09 | Discharge: 2019-04-09 | Disposition: A | Discharge status: Home / Self Care | Payer: Medicaid Other | Source: Ambulatory Visit | Attending: Obstetrics and Gynecology | Admitting: Obstetrics and Gynecology

## 2019-04-09 ENCOUNTER — Other Ambulatory Visit: Payer: Self-pay

## 2019-04-09 DIAGNOSIS — O99213 Obesity complicating pregnancy, third trimester: Secondary | ICD-10-CM

## 2019-04-09 DIAGNOSIS — Z362 Encounter for other antenatal screening follow-up: Secondary | ICD-10-CM | POA: Insufficient documentation

## 2019-04-09 DIAGNOSIS — Z3A29 29 weeks gestation of pregnancy: Secondary | ICD-10-CM

## 2019-04-29 ENCOUNTER — Telehealth: Payer: Medicaid Other

## 2019-04-30 ENCOUNTER — Telehealth (INDEPENDENT_AMBULATORY_CARE_PROVIDER_SITE_OTHER): Payer: Medicaid Other | Admitting: Obstetrics & Gynecology

## 2019-04-30 ENCOUNTER — Other Ambulatory Visit: Payer: Medicaid Other

## 2019-04-30 ENCOUNTER — Other Ambulatory Visit: Payer: Self-pay

## 2019-04-30 VITALS — Wt 199.0 lb

## 2019-04-30 DIAGNOSIS — J45909 Unspecified asthma, uncomplicated: Secondary | ICD-10-CM

## 2019-04-30 DIAGNOSIS — O99013 Anemia complicating pregnancy, third trimester: Secondary | ICD-10-CM

## 2019-04-30 DIAGNOSIS — O99019 Anemia complicating pregnancy, unspecified trimester: Secondary | ICD-10-CM

## 2019-04-30 DIAGNOSIS — Z3493 Encounter for supervision of normal pregnancy, unspecified, third trimester: Secondary | ICD-10-CM

## 2019-04-30 DIAGNOSIS — O0990 Supervision of high risk pregnancy, unspecified, unspecified trimester: Secondary | ICD-10-CM

## 2019-04-30 DIAGNOSIS — O0993 Supervision of high risk pregnancy, unspecified, third trimester: Secondary | ICD-10-CM

## 2019-04-30 DIAGNOSIS — O09299 Supervision of pregnancy with other poor reproductive or obstetric history, unspecified trimester: Secondary | ICD-10-CM

## 2019-04-30 DIAGNOSIS — O24419 Gestational diabetes mellitus in pregnancy, unspecified control: Secondary | ICD-10-CM

## 2019-04-30 DIAGNOSIS — O09293 Supervision of pregnancy with other poor reproductive or obstetric history, third trimester: Secondary | ICD-10-CM

## 2019-04-30 DIAGNOSIS — O99213 Obesity complicating pregnancy, third trimester: Secondary | ICD-10-CM

## 2019-04-30 DIAGNOSIS — O99513 Diseases of the respiratory system complicating pregnancy, third trimester: Secondary | ICD-10-CM

## 2019-04-30 DIAGNOSIS — Z3A32 32 weeks gestation of pregnancy: Secondary | ICD-10-CM

## 2019-04-30 DIAGNOSIS — O99210 Obesity complicating pregnancy, unspecified trimester: Secondary | ICD-10-CM | POA: Insufficient documentation

## 2019-04-30 HISTORY — DX: Anemia complicating pregnancy, unspecified trimester: O99.019

## 2019-04-30 NOTE — Progress Notes (Signed)
I connected with  Renita Papa on 04/30/19 at  9:15 AM EDT by Mychart Video and verified that I am speaking with the correct person using two identifiers.   I discussed the limitations, risks, security and privacy concerns of performing an evaluation and management service by telephone and the availability of in person appointments. I also discussed with the patient that there may be a patient responsible charge related to this service. The patient expressed understanding and agreed to proceed.  Derinda Late, RN 04/30/2019  9:26 AM

## 2019-04-30 NOTE — Progress Notes (Signed)
[place] VIRTUAL VIDEO VISIT ENCOUNTER NOTE  Provider location: [facility] [employer] [facility]   I connected with Renita Papa on 04/30/19 at  9:15 AM EDT by MyChart Video Encounter at home and verified that I am speaking with the correct person using two identifiers.   I discussed the limitations, risks, security and privacy concerns of performing an evaluation and management service virtually and the availability of in person appointments. I also discussed with the patient that there may be a patient responsible charge related to this service. The patient expressed understanding and agreed to proceed. Subjective:  Kayliana Codd is a 23 y.o. G2P0010 at [redacted]w[redacted]d being seen today for ongoing prenatal care.  She is currently monitored for the following issues for this high-risk pregnancy and has Supervision of high risk pregnancy, antepartum; Asthma affecting pregnancy, antepartum; History of miscarriage, currently pregnant; Rh negative state in antepartum period; Alpha thalassemia trait; Gestational diabetes; Obesity in pregnancy; and Anemia in pregnancy on their problem list.  Patient reports no complaints.  Contractions: Not present. Vag. Bleeding: None.  Movement: Present. Denies any leaking of fluid.   The following portions of the patient's history were reviewed and updated as appropriate: allergies, current medications, past family history, past medical history, past social history, past surgical history and problem list.   Objective:   Vitals:   04/30/19 0929  Weight: 199 lb (90.3 kg)   She checked her sugars for 1 day but then didn't check any more. She meets with the dietician this week.  Fetal Status:     Movement: Present     General:  Alert, oriented and cooperative. Patient is in no acute distress.  Respiratory: Normal respiratory effort, no problems with respiration noted  Mental Status: Normal mood and affect. Normal behavior. Normal  judgment and thought content.  Rest of physical exam deferred due to type of encounter  Imaging: [place] Mfm Ob Follow Up  Result Date: 04/10/2019 ----------------------------------------------------------------------  OBSTETRICS REPORT                        (Signed Final 04/10/2019 11:05 am) ---------------------------------------------------------------------- Patient Info  ID #:       [id number]                          D.O.B.:  [birth year] (22 yrs)  Name:       Renita Papa                  Visit Date: 04/09/2019 09:00 am ---------------------------------------------------------------------- Performed By  Performed By:     Berlinda Last          Ref. Address:      [address]                                                              [address]  Attending:        Sander Nephew      [facility]  Referred By:      [facility] ---------------------------------------------------------------------- Orders   #  Description                          Code         Ordered By   1  [place] MFM OB FOLLOW UP                  16109.60     Lin Landsman  ----------------------------------------------------------------------   #  Order #                    Accession #                 Episode #   1  [id number]                  [id number]                  [id number]  ---------------------------------------------------------------------- Indications   Encounter for other antenatal screening        Z36.2   follow-up (Insufficient fetal DNA)   [redacted] weeks gestation of pregnancy                Z3A.29   Obesity complicating pregnancy, third          O99.213   trimester  ---------------------------------------------------------------------- Vital Signs                                                 Height:        5'2"  ---------------------------------------------------------------------- Fetal Evaluation  Num Of Fetuses:          1  Fetal Heart Rate(bpm):   143  Cardiac Activity:        Observed  Presentation:            Cephalic  Placenta:                Anterior  P. Cord Insertion:       Previously Visualized  Amniotic Fluid  AFI FV:      Within normal limits  AFI Sum(cm)     %Tile       Largest Pocket(cm)  13.42           41          5.38  RUQ(cm)       RLQ(cm)       LUQ(cm)        LLQ(cm)  5.38          2.71          1.7            3.63 ---------------------------------------------------------------------- Biometry  BPD:      70.7  mm     G. Age:  28w 3d         20  %    CI:          71.2  %    70 - 86                                                          FL/HC:       20.8  %    19.6 - 20.8  HC:      266.9  mm     G. Age:  29w 1d         20  %    HC/AC:       1.07       0.99 - 1.21  AC:      249.4  mm     G. Age:  29w 1d         48  %    FL/BPD:      78.5  %    71 - 87  FL:       55.5  mm     G. Age:  29w 2d         42  %    FL/AC:       22.3  %    20 - 24  Est. FW:    1339   gm   2 lb 15 oz      41  % ---------------------------------------------------------------------- OB History  Gravidity:    2         Term:   0        Prem:   0        SAB:   1  TOP:          0       Ectopic:  0        Living: 0 ---------------------------------------------------------------------- Gestational Age  LMP:           29w 0d        Date:  09/18/18                 EDD:   06/25/19  U/S Today:     29w 0d                                        EDD:   06/25/19  Best:          29w 0d     Det. By:  LMP  (09/18/18)          EDD:   06/25/19 ---------------------------------------------------------------------- Anatomy  Cranium:               Appears normal         Aortic Arch:            Appears normal  Cavum:                 Previously seen        Ductal Arch:            Appears normal  Ventricles:            Appears normal  Diaphragm:               Appears normal  Choroid Plexus:        Previously seen        Stomach:                Appears normal, left                                                                        sided  Cerebellum:            Previously seen        Abdomen:                Appears normal  Posterior Fossa:       Previously seen        Abdominal Wall:         Previously seen  Nuchal Fold:           Previously seen        Cord Vessels:           Previously seen  Face:                  Orbits and profile     Kidneys:                Appear normal                         previously seen  Lips:                  Previously seen        Bladder:                Appears normal  Thoracic:              Appears normal         Spine:                  Previously seen  Heart:                 Appears normal         Upper Extremities:      Previously seen                         (4CH, axis, and                         situs)  RVOT:                  Previously seen        Lower Extremities:      Previously seen  LVOT:                  Previously seen  Other:  Fetus appears to be female. Nasal bone prev visualized. LT heel prev          visualized. ---------------------------------------------------------------------- Cervix Uterus Adnexa  Cervix  Not visualized (advanced GA >24wks) ---------------------------------------------------------------------- Impression  Normal interval growth. ---------------------------------------------------------------------- Recommendations  Follow up growth in 4 weeks. ----------------------------------------------------------------------  Lin Landsmanorenthian Booker, MD Electronically Signed Final Report   04/10/2019 11:05 am ----------------------------------------------------------------------   Assessment and Plan:  Pregnancy: G2P0010 at [redacted]w[redacted]d 1. Encounter for supervision of low-risk pregnancy in third trimester  - Hemoglobin A1c; Future  2. Gestational diabetes mellitus (GDM) in third trimester,  gestational diabetes method of control unspecified   3. Obesity in pregnancy - rec baby asa  4. Anemia during pregnancy in third trimester - rec OTC iron pill daily  5. Asthma affecting pregnancy, antepartum    Preterm labor symptoms and general obstetric precautions including but not limited to vaginal bleeding, contractions, leaking of fluid and fetal movement were reviewed in detail with the patient. I discussed the assessment and treatment plan with the patient. The patient was provided an opportunity to ask questions and all were answered. The patient agreed with the plan and demonstrated an understanding of the instructions. The patient was advised to call back or seek an in-person office evaluation/[facility] for any urgent or concerning symptoms. Please refer to After Visit Summary for other counseling recommendations.   I provided 11 minutes of face-to-face time during this encounter.  No follow-ups on file.  Future Appointments  Date Time Provider Department Center  05/06/2019  8:15 AM Rosette RevealWOC-EDUCATION WOC-WOCA WOC  05/21/2019  9:00 AM WH-MFC US 3 WH-MFCUS MFC-US    Allie BossierMyra C Sulamita Lafountain, MD [facility]

## 2019-05-01 LAB — HEMOGLOBIN A1C
Est. average glucose Bld gHb Est-mCnc: 128 mg/dL
Hgb A1c MFr Bld: 6.1 % — ABNORMAL HIGH (ref 4.8–5.6)

## 2019-05-06 ENCOUNTER — Other Ambulatory Visit: Payer: Medicaid Other

## 2019-05-06 ENCOUNTER — Telehealth: Payer: Self-pay | Admitting: *Deleted

## 2019-05-06 NOTE — Telephone Encounter (Signed)
Attempted to call patient about her appointment today. I was not able to leave a voicemail because her mailbox was full.

## 2019-05-12 ENCOUNTER — Encounter: Payer: Self-pay | Admitting: General Practice

## 2019-05-12 NOTE — Progress Notes (Unsigned)
Received alert from Babyscripts due to multiple elevated blood sugars. Per chart review, patient has OB visit on 9/3. Will follow up on blood sugars at that time.

## 2019-05-14 ENCOUNTER — Other Ambulatory Visit: Payer: Self-pay

## 2019-05-14 ENCOUNTER — Ambulatory Visit: Payer: Self-pay

## 2019-05-14 ENCOUNTER — Encounter: Payer: Self-pay | Admitting: Obstetrics and Gynecology

## 2019-05-14 ENCOUNTER — Ambulatory Visit (INDEPENDENT_AMBULATORY_CARE_PROVIDER_SITE_OTHER): Payer: Medicaid Other | Admitting: Obstetrics and Gynecology

## 2019-05-14 VITALS — BP 122/75 | HR 96 | Wt 205.4 lb

## 2019-05-14 DIAGNOSIS — O99513 Diseases of the respiratory system complicating pregnancy, third trimester: Secondary | ICD-10-CM

## 2019-05-14 DIAGNOSIS — O24415 Gestational diabetes mellitus in pregnancy, controlled by oral hypoglycemic drugs: Secondary | ICD-10-CM

## 2019-05-14 DIAGNOSIS — Z3A34 34 weeks gestation of pregnancy: Secondary | ICD-10-CM

## 2019-05-14 DIAGNOSIS — Z6791 Unspecified blood type, Rh negative: Secondary | ICD-10-CM

## 2019-05-14 DIAGNOSIS — O0990 Supervision of high risk pregnancy, unspecified, unspecified trimester: Secondary | ICD-10-CM

## 2019-05-14 DIAGNOSIS — J45909 Unspecified asthma, uncomplicated: Secondary | ICD-10-CM | POA: Diagnosis not present

## 2019-05-14 DIAGNOSIS — O99013 Anemia complicating pregnancy, third trimester: Secondary | ICD-10-CM | POA: Diagnosis not present

## 2019-05-14 DIAGNOSIS — O285 Abnormal chromosomal and genetic finding on antenatal screening of mother: Secondary | ICD-10-CM | POA: Insufficient documentation

## 2019-05-14 DIAGNOSIS — O26893 Other specified pregnancy related conditions, third trimester: Secondary | ICD-10-CM | POA: Diagnosis not present

## 2019-05-14 DIAGNOSIS — O0993 Supervision of high risk pregnancy, unspecified, third trimester: Secondary | ICD-10-CM

## 2019-05-14 DIAGNOSIS — O99519 Diseases of the respiratory system complicating pregnancy, unspecified trimester: Secondary | ICD-10-CM

## 2019-05-14 DIAGNOSIS — O26899 Other specified pregnancy related conditions, unspecified trimester: Secondary | ICD-10-CM

## 2019-05-14 MED ORDER — METFORMIN HCL 500 MG PO TABS: 500.0000 mg | ORAL_TABLET | Freq: Every day | ORAL | 1 refills | Status: DC

## 2019-05-15 NOTE — Progress Notes (Signed)
Prenatal Visit Note Date: 05/14/2019 Clinic: [facility]  Subjective:  Crystal May is a 23 y.o. G2P0010 at [redacted]w[redacted]d being seen today for ongoing prenatal care.  She is currently monitored for the following issues for this high-risk pregnancy and has Supervision of high risk pregnancy, antepartum; Asthma affecting pregnancy, antepartum; History of miscarriage, currently pregnant; Rh negative state in antepartum period; Alpha thalassemia trait; Gestational diabetes; Obesity in pregnancy; and Abnormal fetal chromosomal analysis affecting antepartum care of mother on their problem list.  Patient reports no complaints.   Contractions: Not present. Vag. Bleeding: None.  Movement: Present. Denies leaking of fluid.   The following portions of the patient's history were reviewed and updated as appropriate: allergies, current medications, past family history, past medical history, past social history, past surgical history and problem list. Problem list updated.  Objective:   Vitals:   05/14/19 1558  BP: 122/75  Pulse: 96  Weight: 205 lb 6.4 oz (93.2 kg)    Fetal Status: Fetal Heart Rate (bpm): 140   Movement: Present  Presentation: Vertex  General:  Alert, oriented and cooperative. Patient is in no acute distress.  Skin: Skin is warm and dry. No rash noted.   Cardiovascular: Normal heart rate noted  Respiratory: Normal respiratory effort, no problems with respiration noted  Abdomen: Soft, gravid, appropriate for gestational age. Pain/Pressure: Present     Pelvic:  Cervical exam deferred        Extremities: Normal range of motion.  Edema: None  Mental Status: Normal mood and affect. Normal behavior. Normal judgment and thought content.   Urinalysis:      Assessment and Plan:  Pregnancy: G2P0010 at [redacted]w[redacted]d  1. Asthma affecting pregnancy, antepartum Doing well on no meds  2. Supervision of high risk pregnancy, antepartum Routine care. D/w pt more re: BC nv  3. Rh  negative state in antepartum period S/p 28wk rhogam  4. Abnormal fetal chromosomal analysis affecting antepartum care of mother Declined additional testing  6. Gestational diabetes mellitus treated with oral hypoglycemic therapy Recommend start metformin 500 with dinner with am fastings being in the 100s. Likely will need to add on at breakfast too. Will do nst/bpp today and start qwk testing. Has growth for next week already - US FETAL BPP W/NONSTRESS; Future - [place] MFM FETAL BPP WO NON STRESS; Future  Preterm labor symptoms and general obstetric precautions including but not limited to vaginal bleeding, contractions, leaking of fluid and fetal movement were reviewed in detail with the patient. Please refer to After Visit Summary for other counseling recommendations.  Return in about 1 week (around 05/21/2019).   Aletha Halim, MD

## 2019-05-20 DIAGNOSIS — Z029 Encounter for administrative examinations, unspecified: Secondary | ICD-10-CM

## 2019-05-21 ENCOUNTER — Other Ambulatory Visit: Payer: Self-pay

## 2019-05-21 ENCOUNTER — Ambulatory Visit (HOSPITAL_COMMUNITY)
Admission: RE | Admit: 2019-05-21 | Discharge: 2019-05-21 | Disposition: A | Discharge status: Home / Self Care | Payer: Medicaid Other | Source: Ambulatory Visit | Attending: Obstetrics and Gynecology | Admitting: Obstetrics and Gynecology

## 2019-05-21 ENCOUNTER — Encounter (HOSPITAL_COMMUNITY): Payer: Self-pay

## 2019-05-21 ENCOUNTER — Other Ambulatory Visit (HOSPITAL_COMMUNITY): Payer: Self-pay | Admitting: *Deleted

## 2019-05-21 ENCOUNTER — Ambulatory Visit: Payer: Medicaid Other | Admitting: *Deleted

## 2019-05-21 ENCOUNTER — Encounter: Payer: Medicaid Other | Attending: Obstetrics and Gynecology | Admitting: *Deleted

## 2019-05-21 ENCOUNTER — Ambulatory Visit (HOSPITAL_COMMUNITY): Payer: Medicaid Other | Admitting: *Deleted

## 2019-05-21 VITALS — BP 123/77 | HR 102 | Temp 98.5°F

## 2019-05-21 DIAGNOSIS — O0990 Supervision of high risk pregnancy, unspecified, unspecified trimester: Secondary | ICD-10-CM | POA: Diagnosis present

## 2019-05-21 DIAGNOSIS — Z3A00 Weeks of gestation of pregnancy not specified: Secondary | ICD-10-CM | POA: Diagnosis not present

## 2019-05-21 DIAGNOSIS — O99210 Obesity complicating pregnancy, unspecified trimester: Secondary | ICD-10-CM

## 2019-05-21 DIAGNOSIS — J45909 Unspecified asthma, uncomplicated: Secondary | ICD-10-CM | POA: Insufficient documentation

## 2019-05-21 DIAGNOSIS — Z3A35 35 weeks gestation of pregnancy: Secondary | ICD-10-CM | POA: Diagnosis not present

## 2019-05-21 DIAGNOSIS — O24415 Gestational diabetes mellitus in pregnancy, controlled by oral hypoglycemic drugs: Secondary | ICD-10-CM | POA: Diagnosis not present

## 2019-05-21 DIAGNOSIS — O09299 Supervision of pregnancy with other poor reproductive or obstetric history, unspecified trimester: Secondary | ICD-10-CM | POA: Diagnosis present

## 2019-05-21 DIAGNOSIS — Z7984 Long term (current) use of oral hypoglycemic drugs: Secondary | ICD-10-CM | POA: Insufficient documentation

## 2019-05-21 DIAGNOSIS — Z713 Dietary counseling and surveillance: Secondary | ICD-10-CM | POA: Diagnosis not present

## 2019-05-21 DIAGNOSIS — Z362 Encounter for other antenatal screening follow-up: Secondary | ICD-10-CM | POA: Diagnosis not present

## 2019-05-21 DIAGNOSIS — O99519 Diseases of the respiratory system complicating pregnancy, unspecified trimester: Secondary | ICD-10-CM | POA: Insufficient documentation

## 2019-05-21 DIAGNOSIS — O99213 Obesity complicating pregnancy, third trimester: Secondary | ICD-10-CM | POA: Diagnosis not present

## 2019-05-21 NOTE — Progress Notes (Signed)
  Patient was seen on 05/21/2019 for Gestational Diabetes self-management. EDD 06/25/2019. Patient states no history of GDM. Diet history obtained. Patient eats good variety of all food groups but only 2 meals a day and very few snacks. Beverages include water and Crystal Light.  The following learning objectives were met by the patient :   States the definition of Gestational Diabetes  States why dietary management is important in controlling blood glucose  Describes the effects of carbohydrates on blood glucose levels  Demonstrates ability to create a balanced meal plan  Demonstrates carbohydrate counting   States when to check blood glucose levels  Demonstrates proper blood glucose monitoring techniques  States the effect of stress and exercise on blood glucose levels  States the importance of limiting caffeine and abstaining from alcohol and smoking  Plan:  Aim for 3 Carb Choices per meal (45 grams) +/- 1 either way  Aim for 1-2 Carbs per snack Begin reading food labels for Total Carbohydrate of foods If OK with your MD, consider  increasing your activity level by walking, Arm Chair Exercises or other activity daily as tolerated Begin checking BG before breakfast and 2 hours after first bite of breakfast, lunch and dinner as directed by MD  Bring Log Book/Sheet and meter to every medical appointment OR use Baby Scripts (see below) Baby Scripts:  Patient was introduced to [employer] and is using it as record of BG electronically  Take medication if directed by MD  Patient already has a meter: Accu Chek Guide And is testing pre breakfast and 2 hours each meal as directed by MD Review Baby Scripts shows: her fasting blood sugars were elevated until she started taking Metformin, most of them are within target ranges now. Her post meal blood sugars are now better too.    Patient instructed to monitor glucose levels: FBS: 60 - 95 mg/dl 2 hour: <120 mg/dl  Patient received the  following handouts:  Nutrition Diabetes and Pregnancy  Carbohydrate Counting List  Patient will be seen for follow-up as needed.

## 2019-05-28 ENCOUNTER — Encounter: Payer: Self-pay | Admitting: Obstetrics and Gynecology

## 2019-05-28 ENCOUNTER — Ambulatory Visit (INDEPENDENT_AMBULATORY_CARE_PROVIDER_SITE_OTHER): Payer: Medicaid Other | Admitting: Obstetrics and Gynecology

## 2019-05-28 ENCOUNTER — Telehealth: Payer: Medicaid Other | Admitting: Obstetrics and Gynecology

## 2019-05-28 ENCOUNTER — Ambulatory Visit (HOSPITAL_COMMUNITY): Payer: Medicaid Other | Admitting: *Deleted

## 2019-05-28 ENCOUNTER — Other Ambulatory Visit: Payer: Self-pay

## 2019-05-28 ENCOUNTER — Encounter: Payer: Self-pay | Admitting: Family Medicine

## 2019-05-28 ENCOUNTER — Ambulatory Visit: Payer: Medicaid Other | Admitting: *Deleted

## 2019-05-28 ENCOUNTER — Other Ambulatory Visit: Payer: Self-pay | Admitting: Obstetrics and Gynecology

## 2019-05-28 ENCOUNTER — Other Ambulatory Visit (HOSPITAL_COMMUNITY)
Admission: RE | Admit: 2019-05-28 | Discharge: 2019-05-28 | Disposition: A | Discharge status: Home / Self Care | Payer: Medicaid Other | Source: Ambulatory Visit | Attending: Obstetrics and Gynecology | Admitting: Obstetrics and Gynecology

## 2019-05-28 ENCOUNTER — Ambulatory Visit (HOSPITAL_COMMUNITY)
Admission: RE | Admit: 2019-05-28 | Discharge: 2019-05-28 | Disposition: A | Discharge status: Home / Self Care | Payer: Medicaid Other | Source: Ambulatory Visit | Attending: Obstetrics and Gynecology | Admitting: Obstetrics and Gynecology

## 2019-05-28 ENCOUNTER — Encounter (HOSPITAL_COMMUNITY): Payer: Self-pay

## 2019-05-28 VITALS — BP 126/85 | HR 105

## 2019-05-28 DIAGNOSIS — J45909 Unspecified asthma, uncomplicated: Secondary | ICD-10-CM | POA: Diagnosis present

## 2019-05-28 DIAGNOSIS — Z3689 Encounter for other specified antenatal screening: Secondary | ICD-10-CM

## 2019-05-28 DIAGNOSIS — O99210 Obesity complicating pregnancy, unspecified trimester: Secondary | ICD-10-CM

## 2019-05-28 DIAGNOSIS — O99213 Obesity complicating pregnancy, third trimester: Secondary | ICD-10-CM

## 2019-05-28 DIAGNOSIS — O0990 Supervision of high risk pregnancy, unspecified, unspecified trimester: Secondary | ICD-10-CM | POA: Insufficient documentation

## 2019-05-28 DIAGNOSIS — O09299 Supervision of pregnancy with other poor reproductive or obstetric history, unspecified trimester: Secondary | ICD-10-CM

## 2019-05-28 DIAGNOSIS — Z3A36 36 weeks gestation of pregnancy: Secondary | ICD-10-CM

## 2019-05-28 DIAGNOSIS — O0993 Supervision of high risk pregnancy, unspecified, third trimester: Secondary | ICD-10-CM

## 2019-05-28 DIAGNOSIS — Z6791 Unspecified blood type, Rh negative: Secondary | ICD-10-CM

## 2019-05-28 DIAGNOSIS — O99519 Diseases of the respiratory system complicating pregnancy, unspecified trimester: Secondary | ICD-10-CM | POA: Diagnosis present

## 2019-05-28 DIAGNOSIS — O24415 Gestational diabetes mellitus in pregnancy, controlled by oral hypoglycemic drugs: Secondary | ICD-10-CM

## 2019-05-28 DIAGNOSIS — O26893 Other specified pregnancy related conditions, third trimester: Secondary | ICD-10-CM

## 2019-05-28 DIAGNOSIS — O26899 Other specified pregnancy related conditions, unspecified trimester: Secondary | ICD-10-CM

## 2019-05-28 DIAGNOSIS — O285 Abnormal chromosomal and genetic finding on antenatal screening of mother: Secondary | ICD-10-CM

## 2019-05-28 NOTE — Progress Notes (Signed)
   PRENATAL VISIT NOTE  Subjective:  Crystal May is a 23 y.o. G2P0010 at [redacted]w[redacted]d being seen today for ongoing prenatal care.  She is currently monitored for the following issues for this high-risk pregnancy and has Supervision of high risk pregnancy, antepartum; Asthma affecting pregnancy, antepartum; History of miscarriage, currently pregnant; Rh negative state in antepartum period; Alpha thalassemia trait; Gestational diabetes; Obesity in pregnancy; and Abnormal fetal chromosomal analysis affecting antepartum care of mother on their problem list.  Patient reports no complaints.  Contractions: Not present. Vag. Bleeding: None.  Movement: Present. Denies leaking of fluid.   The following portions of the patient's history were reviewed and updated as appropriate: allergies, current medications, past family history, past medical history, past social history, past surgical history and problem list.   Objective:   Vitals:   05/28/19 1059  BP: 126/85  Pulse: (!) 105    Fetal Status: Fetal Heart Rate (bpm): 130   Movement: Present     General:  Alert, oriented and cooperative. Patient is in no acute distress.  Skin: Skin is warm and dry. No rash noted.   Cardiovascular: Normal heart rate noted  Respiratory: Normal respiratory effort, no problems with respiration noted  Abdomen: Soft, gravid, appropriate for gestational age.  Pain/Pressure: Present     Pelvic: Cervical exam deferred        Extremities: Normal range of motion.  Edema: None  Mental Status: Normal mood and affect. Normal behavior. Normal judgment and thought content.   Assessment and Plan:  Pregnancy: G2P0010 at [redacted]w[redacted]d  1. Supervision of high risk pregnancy, antepartum - Culture, beta strep (group b only) - GC/Chlamydia probe amp ([place])not at [facility]  2. Rh negative state in antepartum period Rh workup post delivery  3. Abnormal fetal chromosomal analysis affecting antepartum care of mother Insufficient cells,  declined further testing  4. Obesity in pregnancy  5. Gestational diabetes mellitus (GDM) controlled on oral hypoglycemic drug, antepartum Cont weekly NST/BPP BPP today at MFM, read not back yet For NST today Currently on metformin 500 mg w/ dinner FG: 80-100s PP: 90-120 Increase evening metformin to 1000 mg with dinner  Preterm labor symptoms and general obstetric precautions including but not limited to vaginal bleeding, contractions, leaking of fluid and fetal movement were reviewed in detail with the patient. Please refer to After Visit Summary for other counseling recommendations.   Return in about 1 week (around 06/04/2019) for high OB, virtual.  Future Appointments  Date Time Provider [place]  05/28/2019 11:15 AM WOC-WOCA NST WOC-WOCA WOC  06/04/2019  8:45 AM [place] 5 WH-MFCUS MFC-US  06/04/2019  9:00 AM [place] MFC-US  06/11/2019  9:45 AM [place] 5 WH-MFCUS MFC-US  06/11/2019  9:50 AM WH-MFC NURSE WH-MFC MFC-US  06/18/2019  8:30 AM [place] MFC-US  06/18/2019  8:30 AM [place] 1 WH-MFCUS MFC-US    Sloan Leiter, MD

## 2019-05-29 LAB — CERVICOVAGINAL ANCILLARY ONLY
Chlamydia: NEGATIVE
Neisseria Gonorrhea: NEGATIVE

## 2019-05-31 LAB — CULTURE, BETA STREP (GROUP B ONLY): Strep Gp B Culture: NEGATIVE

## 2019-06-04 ENCOUNTER — Ambulatory Visit (HOSPITAL_COMMUNITY)
Admission: RE | Admit: 2019-06-04 | Discharge: 2019-06-04 | Disposition: A | Discharge status: Home / Self Care | Payer: Medicaid Other | Source: Ambulatory Visit | Attending: Obstetrics and Gynecology | Admitting: Obstetrics and Gynecology

## 2019-06-04 ENCOUNTER — Other Ambulatory Visit: Payer: Self-pay

## 2019-06-04 ENCOUNTER — Ambulatory Visit (HOSPITAL_COMMUNITY): Payer: Medicaid Other | Admitting: *Deleted

## 2019-06-04 ENCOUNTER — Encounter (HOSPITAL_COMMUNITY): Payer: Self-pay

## 2019-06-04 ENCOUNTER — Telehealth: Payer: Self-pay | Admitting: Obstetrics & Gynecology

## 2019-06-04 DIAGNOSIS — Z3A37 37 weeks gestation of pregnancy: Secondary | ICD-10-CM | POA: Diagnosis not present

## 2019-06-04 DIAGNOSIS — O99519 Diseases of the respiratory system complicating pregnancy, unspecified trimester: Secondary | ICD-10-CM

## 2019-06-04 DIAGNOSIS — O99210 Obesity complicating pregnancy, unspecified trimester: Secondary | ICD-10-CM | POA: Diagnosis present

## 2019-06-04 DIAGNOSIS — J45909 Unspecified asthma, uncomplicated: Secondary | ICD-10-CM | POA: Insufficient documentation

## 2019-06-04 DIAGNOSIS — O09299 Supervision of pregnancy with other poor reproductive or obstetric history, unspecified trimester: Secondary | ICD-10-CM | POA: Diagnosis present

## 2019-06-04 DIAGNOSIS — O0990 Supervision of high risk pregnancy, unspecified, unspecified trimester: Secondary | ICD-10-CM | POA: Diagnosis present

## 2019-06-04 DIAGNOSIS — O24415 Gestational diabetes mellitus in pregnancy, controlled by oral hypoglycemic drugs: Secondary | ICD-10-CM | POA: Diagnosis present

## 2019-06-04 DIAGNOSIS — Z3689 Encounter for other specified antenatal screening: Secondary | ICD-10-CM | POA: Diagnosis not present

## 2019-06-04 NOTE — Telephone Encounter (Signed)
Attempted to contact patient about her appointment on 9/24 @ 9:55. No answer and could not leave a message because the mailbox was full.

## 2019-06-05 ENCOUNTER — Telehealth (INDEPENDENT_AMBULATORY_CARE_PROVIDER_SITE_OTHER): Payer: Medicaid Other | Admitting: Obstetrics & Gynecology

## 2019-06-05 ENCOUNTER — Encounter: Payer: Self-pay | Admitting: Obstetrics & Gynecology

## 2019-06-05 VITALS — BP 121/79 | HR 89

## 2019-06-05 DIAGNOSIS — O99210 Obesity complicating pregnancy, unspecified trimester: Secondary | ICD-10-CM

## 2019-06-05 DIAGNOSIS — O99213 Obesity complicating pregnancy, third trimester: Secondary | ICD-10-CM

## 2019-06-05 DIAGNOSIS — O99519 Diseases of the respiratory system complicating pregnancy, unspecified trimester: Secondary | ICD-10-CM

## 2019-06-05 DIAGNOSIS — O0993 Supervision of high risk pregnancy, unspecified, third trimester: Secondary | ICD-10-CM

## 2019-06-05 DIAGNOSIS — O0990 Supervision of high risk pregnancy, unspecified, unspecified trimester: Secondary | ICD-10-CM

## 2019-06-05 DIAGNOSIS — O24410 Gestational diabetes mellitus in pregnancy, diet controlled: Secondary | ICD-10-CM

## 2019-06-05 DIAGNOSIS — O99513 Diseases of the respiratory system complicating pregnancy, third trimester: Secondary | ICD-10-CM

## 2019-06-05 DIAGNOSIS — Z6791 Unspecified blood type, Rh negative: Secondary | ICD-10-CM

## 2019-06-05 DIAGNOSIS — O26893 Other specified pregnancy related conditions, third trimester: Secondary | ICD-10-CM

## 2019-06-05 DIAGNOSIS — D563 Thalassemia minor: Secondary | ICD-10-CM

## 2019-06-05 DIAGNOSIS — O285 Abnormal chromosomal and genetic finding on antenatal screening of mother: Secondary | ICD-10-CM

## 2019-06-05 DIAGNOSIS — J45909 Unspecified asthma, uncomplicated: Secondary | ICD-10-CM

## 2019-06-05 DIAGNOSIS — Z3A37 37 weeks gestation of pregnancy: Secondary | ICD-10-CM

## 2019-06-05 NOTE — Progress Notes (Signed)
TELEHEALTH OBSTETRICS PRENATAL VIRTUAL VIDEO VISIT ENCOUNTER NOTE  Provider location: [facility] [place]   I connected with Crystal May on 06/05/19 at  9:15 AM EDT byMyChart Video Encounter at home and verified that I am speaking with the correct person using two identifiers.   I discussed the limitations, risks, security and privacy concerns of performing an evaluation and management service virtually and the availability of in person appointments. I also discussed with the patient that there may be a patient responsible charge related to this service. The patient expressed understanding and agreed to proceed. Subjective:  Crystal May is a 23 y.o. G2P0010 at [redacted]w[redacted]d being seen today for ongoing prenatal care.  She is currently monitored for the following issues for this high-risk pregnancy and has Supervision of high risk pregnancy, antepartum; Asthma affecting pregnancy, antepartum; History of miscarriage, currently pregnant; Rh negative state in antepartum period; Alpha thalassemia trait; Gestational diabetes; Obesity in pregnancy; and Abnormal fetal chromosomal analysis affecting antepartum care of mother on their problem list.  Patient reports nausea.  Contractions: Irritability. Vag. Bleeding: None.  Movement: Present. Denies any leaking of fluid.   The following portions of the patient's history were reviewed and updated as appropriate: allergies, current medications, past family history, past medical history, past social history, past surgical history and problem list.   Objective:   Vitals:   06/05/19 0942  BP: 121/79  Pulse: 89    Fetal Status:     Movement: Present     General:  Alert, oriented and cooperative. Patient is in no acute distress.  Respiratory: Normal respiratory effort, no problems with respiration noted  Mental Status: Normal mood and affect. Normal behavior. Normal judgment and thought content.  Rest of physical exam deferred due to  type of encounter  Imaging: [place] Mfm Fetal Bpp Wo Non Stress  Result Date: 06/04/2019 ----------------------------------------------------------------------  OBSTETRICS REPORT                        (Signed Final 06/04/2019 11:28 am) ---------------------------------------------------------------------- Patient Info  ID #:       [id number]                          D.O.B.:  [birth year] (22 yrs)  Name:       Crystal May                  Visit Date: 06/04/2019 08:37 am ---------------------------------------------------------------------- Performed By  Performed By:     Earley Brooke     Ref. Address:      [address]                    [place], [address]                                                              [address]  Attending:        Lin Landsman      [facility]                    MD  [facility]  Referred By:      [facility]am ---------------------------------------------------------------------- Orders   #  Description                          Code         Ordered By   1  [place] MFM FETAL BPP WO NON              76819.01     YU FANG      STRESS  ----------------------------------------------------------------------   #  Order #                    Accession #                 Episode #   1  [id number]                  [id number]                  [id number]  ---------------------------------------------------------------------- Indications   Gestational diabetes in pregnancy,             O24.415   controlled by oral hypoglycemic drugs   [redacted] weeks gestation of pregnancy                Z3A.37   Encounter for other specified antenatal        Z36.89   screening   Insufficient fetal DNA  ---------------------------------------------------------------------- Vital Signs                                                 Height:        5'2" ---------------------------------------------------------------------- Fetal Evaluation  Num Of Fetuses:          1  Fetal  Heart Rate(bpm):   154  Cardiac Activity:        Observed  Presentation:            Cephalic  Amniotic Fluid  AFI FV:      Within normal limits  AFI Sum(cm)     %Tile       Largest Pocket(cm)  14.88           56          4.98  RUQ(cm)       RLQ(cm)       LUQ(cm)        LLQ(cm)  2.48          4.98          4.06           3.36 ---------------------------------------------------------------------- Biophysical Evaluation  Amniotic F.V:   Within normal limits       F. Tone:         Observed  F. Movement:    Observed                   Score:           8/8  F. Breathing:   Observed ---------------------------------------------------------------------- OB History  Gravidity:    2         Term:   0        Prem:   0        SAB:   1  TOP:  0       Ectopic:  0        Living: 0 ---------------------------------------------------------------------- Gestational Age  LMP:           37w 0d        Date:  09/18/18                 EDD:   06/25/19  Best:          Richarda Osmond 0d     Det. By:  LMP  (09/18/18)          EDD:   06/25/19 ---------------------------------------------------------------------- Impression  Biophysical profile 8/8  A2GDM  Cell Free DNA with insufficient Fetal fraction- Ms. Pianka  declined further testing ---------------------------------------------------------------------- Recommendations  Continue weekly testing, follow up scheduld on 9/30 and 10/7 ----------------------------------------------------------------------               Lin Landsman, MD Electronically Signed Final Report   06/04/2019 11:28 am ----------------------------------------------------------------------  [place] Mfm Fetal Bpp Wo Non Stress  Result Date: 05/28/2019 ----------------------------------------------------------------------  OBSTETRICS REPORT                       (Signed Final 05/28/2019 11:06 pm) ---------------------------------------------------------------------- Patient Info  ID #:       [id number]                           D.O.B.:  Aug 08, 1996 (22 yrs)  Name:       Crystal May                  Visit Date: 05/28/2019 09:08 am ---------------------------------------------------------------------- Performed By  Performed By:     Percell Boston          Ref. Address:      [address]                    [place]                                                              [address]  Attending:        Lin Landsman      [facility]                    MD                                        Fetal Care  Referred By:      [facility] ---------------------------------------------------------------------- Orders   #  Description                          Code         Ordered By   1  [place] MFM FETAL BPP WO NON              30865.78     YU FANG      STRESS  ----------------------------------------------------------------------   #  Order #  Accession #                 Episode #   1  [id number]                  [id number]                  [id number]  ---------------------------------------------------------------------- Indications   [redacted] weeks gestation of pregnancy                Z3A.36   Gestational diabetes in pregnancy,             O24.415   controlled by oral hypoglycemic drugs   Encounter for other specified antenatal        Z36.89   screening  ---------------------------------------------------------------------- Vital Signs                                                 Height:        5'2" ---------------------------------------------------------------------- Fetal Evaluation  Num Of Fetuses:          1  Fetal Heart Rate(bpm):   159  Cardiac Activity:        Observed  Presentation:            Cephalic  Amniotic Fluid  AFI FV:      Within normal limits  AFI Sum(cm)     %Tile       Largest Pocket(cm)  14.01           51          6.39  RUQ(cm)       RLQ(cm)       LUQ(cm)        LLQ(cm)  6.39          3.05          2.53           2.04 ----------------------------------------------------------------------  Biophysical Evaluation  Amniotic F.V:   Within normal limits       F. Tone:         Observed  F. Movement:    Observed                   Score:           8/8  F. Breathing:   Observed ---------------------------------------------------------------------- OB History  Gravidity:    2         Term:   0        Prem:   0        SAB:   1  TOP:          0       Ectopic:  0        Living: 0 ---------------------------------------------------------------------- Gestational Age  LMP:           36w 0d        Date:  09/18/18                 EDD:   06/25/19  Best:          Stevie Kern 0d     Det. By:  LMP  (09/18/18)          EDD:   06/25/19 ---------------------------------------------------------------------- Anatomy  Thoracic:              Appears normal  Bladder:                Appears normal  Stomach:               Appears normal, left                         sided ---------------------------------------------------------------------- Impression  A2GDM  BPP 8/8 ---------------------------------------------------------------------- Recommendations  Continue weekly testing  Consider delivery between 37-39 weeks given glucose control ----------------------------------------------------------------------               Lin Landsman, MD Electronically Signed Final Report   05/28/2019 11:06 pm ----------------------------------------------------------------------  [place] Mfm Fetal Bpp Wo Non Stress  Result Date: 05/21/2019 ----------------------------------------------------------------------  OBSTETRICS REPORT                       (Signed Final 05/21/2019 11:01 am) ---------------------------------------------------------------------- Patient Info  ID #:       [id number]                          D.O.B.:  [birth year] (22 yrs)  Name:       Crystal May                  Visit Date: 05/21/2019 09:06 am ---------------------------------------------------------------------- Performed By  Performed By:     Sandi Mealy        Ref.  Address:     [address]                    [place]                                                             [address]  Attending:        Ma Rings MD         [facility]                                                             [facility]  Referred By:      [facility] ---------------------------------------------------------------------- Orders   #  Description                          Code         Ordered By   1  [place] MFM OB FOLLOW UP                  40981.19     Lin Landsman   2  [place] MFM FETAL BPP WO NON  16109.60     CHARLIE PICKENS      STRESS  ----------------------------------------------------------------------   #  Order #                    Accession #                 Episode #   1  [id number]                  [id number]                  [id number]   2  [id number]                  [id number]                  [id number]  ---------------------------------------------------------------------- Indications   Gestational diabetes in pregnancy,             O24.415   controlled by oral hypoglycemic drugs   [redacted] weeks gestation of pregnancy                Z3A.35  ---------------------------------------------------------------------- Vital Signs  Weight (lb): 205                               Height:        5'2"  BMI:         37.49 ---------------------------------------------------------------------- Fetal Evaluation  Num Of Fetuses:         1  Fetal Heart Rate(bpm):  165  Cardiac Activity:       Observed  Presentation:           Cephalic  Placenta:               Anterior  P. Cord Insertion:      Visualized  Amniotic Fluid  AFI FV:      Within normal limits  AFI Sum(cm)     %Tile       Largest Pocket(cm)  11.86           34          6.47  RUQ(cm)       RLQ(cm)       LUQ(cm)        LLQ(cm)  5.39          0             6.47           0 ---------------------------------------------------------------------- Biophysical  Evaluation  Amniotic F.V:   Within normal limits       F. Tone:        Observed  F. Movement:    Observed                   Score:          8/8  F. Breathing:   Observed ---------------------------------------------------------------------- Biometry  BPD:      87.8  mm     G. Age:  35w 3d         66  %    CI:        84.02   %    70 - 86  FL/HC:      21.8   %    20.1 - 22.3  HC:      301.9  mm     G. Age:  33w 4d          2  %    HC/AC:      0.94        0.93 - 1.11  AC:       321   mm     G. Age:  36w 0d         83  %    FL/BPD:     74.9   %    71 - 87  FL:       65.8  mm     G. Age:  33w 6d         17  %    FL/AC:      20.5   %    20 - 24  HUM:      55.5  mm     G. Age:  32w 2d         11  %  Est. FW:    2603  gm    5 lb 12 oz      51  % ---------------------------------------------------------------------- OB History  Gravidity:    2         Term:   0        Prem:   0        SAB:   1  TOP:          0       Ectopic:  0        Living: 0 ---------------------------------------------------------------------- Gestational Age  LMP:           35w 0d        Date:  09/18/18                 EDD:   06/25/19  U/S Today:     34w 5d                                        EDD:   06/27/19  Best:          35w 0d     Det. By:  LMP  (09/18/18)          EDD:   06/25/19 ---------------------------------------------------------------------- Anatomy  Cranium:               Appears normal         Stomach:                Appears normal, left                                                                        sided  Cavum:                 Appears normal         Abdomen:                Appears normal  Thoracic:  Appears normal         Kidneys:                Appear normal  Diaphragm:             Appears normal         Bladder:                Appears normal ---------------------------------------------------------------------- Comments  This patient was seen for a follow  up growth scan due to  recently diagnosed gestational diabetes that is currently  treated with metformin.  The patient reports that her  fingerstick values have mostly been within normal limits.  She was informed that the fetal growth and amniotic fluid  level appears appropriate for her gestational age.  A biophysical profile performed today was 8 out of 8.  Due to A2 gestational diabetes, we will continue to follow the  patient with weekly fetal testing which has been scheduled in  our office. ----------------------------------------------------------------------                   Ma Rings, MD Electronically Signed Final Report   05/21/2019 11:01 am ----------------------------------------------------------------------  [place] Mfm Ob Follow Up  Result Date: 05/21/2019 ----------------------------------------------------------------------  OBSTETRICS REPORT                       (Signed Final 05/21/2019 11:01 am) ---------------------------------------------------------------------- Patient Info  ID #:       [id number]                          D.O.B.:  [birth year] (22 yrs)  Name:       Crystal May                  Visit Date: 05/21/2019 09:06 am ---------------------------------------------------------------------- Performed By  Performed By:     Sandi Mealy        Ref. Address:     [address]                    [place]                                                             [address]  Attending:        Ma Rings MD         [facility]  Referred By:      [facility] ---------------------------------------------------------------------- Orders   #  Description                          Code         Ordered By   1  [place] MFM OB FOLLOW UP  16109.60     Lin Landsman   2  [place] MFM FETAL BPP WO NON              76819.01     CHARLIE PICKENS       STRESS  ----------------------------------------------------------------------   #  Order #                    Accession #                 Episode #   1  [id number]                  [id number]                  [id number]   2  [id number]                  [id number]                  [id number]  ---------------------------------------------------------------------- Indications   Gestational diabetes in pregnancy,             O24.415   controlled by oral hypoglycemic drugs   [redacted] weeks gestation of pregnancy                Z3A.35  ---------------------------------------------------------------------- Vital Signs  Weight (lb): 205                               Height:        5'2"  BMI:         37.49 ---------------------------------------------------------------------- Fetal Evaluation  Num Of Fetuses:         1  Fetal Heart Rate(bpm):  165  Cardiac Activity:       Observed  Presentation:           Cephalic  Placenta:               Anterior  P. Cord Insertion:      Visualized  Amniotic Fluid  AFI FV:      Within normal limits  AFI Sum(cm)     %Tile       Largest Pocket(cm)  11.86           34          6.47  RUQ(cm)       RLQ(cm)       LUQ(cm)        LLQ(cm)  5.39          0             6.47           0 ---------------------------------------------------------------------- Biophysical Evaluation  Amniotic F.V:   Within normal limits       F. Tone:        Observed  F. Movement:    Observed                   Score:          8/8  F. Breathing:   Observed ---------------------------------------------------------------------- Biometry  BPD:  87.8  mm     G. Age:  35w 3d         66  %    CI:        84.02   %    70 - 86                                                          FL/HC:      21.8   %    20.1 - 22.3  HC:      301.9  mm     G. Age:  33w 4d          2  %    HC/AC:      0.94        0.93 - 1.11  AC:       321   mm     G. Age:  36w 0d         83  %    FL/BPD:     74.9   %    71 - 87  FL:       65.8  mm     G. Age:  33w 6d          17  %    FL/AC:      20.5   %    20 - 24  HUM:      55.5  mm     G. Age:  32w 2d         11  %  Est. FW:    2603  gm    5 lb 12 oz      51  % ---------------------------------------------------------------------- OB History  Gravidity:    2         Term:   0        Prem:   0        SAB:   1  TOP:          0       Ectopic:  0        Living: 0 ---------------------------------------------------------------------- Gestational Age  LMP:           35w 0d        Date:  09/18/18                 EDD:   06/25/19  U/S Today:     34w 5d                                        EDD:   06/27/19  Best:          35w 0d     Det. By:  LMP  (09/18/18)          EDD:   06/25/19 ---------------------------------------------------------------------- Anatomy  Cranium:               Appears normal         Stomach:                Appears normal, left  sided  Cavum:                 Appears normal         Abdomen:                Appears normal  Thoracic:              Appears normal         Kidneys:                Appear normal  Diaphragm:             Appears normal         Bladder:                Appears normal ---------------------------------------------------------------------- Comments  This patient was seen for a follow up growth scan due to  recently diagnosed gestational diabetes that is currently  treated with metformin.  The patient reports that her  fingerstick values have mostly been within normal limits.  She was informed that the fetal growth and amniotic fluid  level appears appropriate for her gestational age.  A biophysical profile performed today was 8 out of 8.  Due to A2 gestational diabetes, we will continue to follow the  patient with weekly fetal testing which has been scheduled in  our office. ----------------------------------------------------------------------                   Ma Rings, MD Electronically Signed Final Report   05/21/2019  11:01 am ----------------------------------------------------------------------  US Fetal Bpp W/nonstress  Result Date: 05/16/2019 ----------------------------------------------------------------------  OBSTETRICS REPORT                       (Signed Final 05/16/2019 07:55 am) ---------------------------------------------------------------------- Patient Info  ID #:       [id number]                          D.O.B.:  04-04-1996 (22 yrs)  Name:       Crystal May                  Visit Date: 05/14/2019 04:53 pm ---------------------------------------------------------------------- Performed By  Performed By:     Sedalia Muta Day [facility]NC          Ref. Address:     [address]                                                             [address]  Attending:        [place] Bing MD     [facility]  [facility] ---------------------------------------------------------------------- Orders   #  Description                          Code         Ordered By   1  US FETAL BPP W/NONSTRESS             16109.676818.4      [place] BingHARLIE PICKENS  ----------------------------------------------------------------------   #  Order #                    Accession #                 Episode #   1  [id number]                  [id number]                  [id number]  ---------------------------------------------------------------------- Service(s) Provided   US Fetal BPP W NST                                   [phone]  ---------------------------------------------------------------------- Indications   [redacted] weeks gestation of pregnancy                Z3A.34   Gestational diabetes in pregnancy,             O24.415   controlled by oral hypoglycemic drugs  ---------------------------------------------------------------------- Vital Signs                                                 Height:         5'2" ---------------------------------------------------------------------- Fetal Evaluation  Num Of Fetuses:         1  Preg. Location:         Intrauterine  Cardiac Activity:       Observed  Presentation:           Cephalic  Amniotic Fluid  AFI FV:      Within normal limits  AFI Sum(cm)     %Tile       Largest Pocket(cm)  14.17           50          6.72  RUQ(cm)       RLQ(cm)       LUQ(cm)        LLQ(cm)  6.72          2.43          2.56           2.46 ---------------------------------------------------------------------- Biophysical Evaluation  Amniotic F.V:   Pocket => 2 cm two         F. Tone:        Observed                  planes  F. Movement:    Observed                   N.S.T:          Reactive  F. Breathing:   Observed  Score:          10/10 ---------------------------------------------------------------------- OB History  Gravidity:    2         Term:   0        Prem:   0        SAB:   1  TOP:          0       Ectopic:  0        Living: 0 ---------------------------------------------------------------------- Gestational Age  LMP:           34w 0d        Date:  09/18/18                 EDD:   06/25/19  Best:          34w 0d     Det. By:  LMP  (09/18/18)          EDD:   06/25/19 ---------------------------------------------------------------------- Impression  Reassuing antenatal testing ---------------------------------------------------------------------- Recommendations  Continue with weekly testing ----------------------------------------------------------------------                 [place] Bing, MD Electronically Signed Final Report   05/16/2019 07:55 am ----------------------------------------------------------------------   Assessment and Plan:  Pregnancy: G2P0010 at [redacted]w[redacted]d 1. Supervision of high risk pregnancy, antepartum +FM  Schedule for IOL at 39 weeks  2. Rh negative state in antepartum period Pt is s/p Rhogam   3. Obesity in pregnancy  4. Diet  controlled gestational diabetes mellitus (GDM), antepartum Levels not lists in BabyScripts.  Fasting glc 80-90 2 hours 1-2- 115 (had one 120)    5. Asthma affecting pregnancy, antepartum stable  6. Abnormal fetal chromosomal analysis affecting antepartum care of mother NIPS done but, insufficient cells. Pt declined to repeat.   7. Alpha thalassemia trait  8. Contraception counseling Pt declines contraception    Term labor symptoms and general obstetric precautions including but not limited to vaginal bleeding, contractions, leaking of fluid and fetal movement were reviewed in detail with the patient. I discussed the assessment and treatment plan with the patient. The patient was provided an opportunity to ask questions and all were answered. The patient agreed with the plan and demonstrated an understanding of the instructions. The patient was advised to call back or seek an in-person office evaluation/go to MAU at [facility] for any urgent or concerning symptoms. Please refer to After Visit Summary for other counseling recommendations.   I provided 14 minutes of face-to-face time during this encounter.  Return in about 1 week (around 06/12/2019).  Future Appointments  Date Time Provider Department Center  06/11/2019  9:45 AM WH-MFC [place] 5 WH-MFCUS MFC-US  06/11/2019  9:50 AM WH-MFC NURSE WH-MFC MFC-US  06/13/2019 11:15 AM Hermina Staggers, MD WOC-WOCA WOC  06/18/2019  8:30 AM WH-MFC NURSE WH-MFC MFC-US  06/18/2019  8:30 AM WH-MFC [place] 1 WH-MFCUS MFC-US    Willodean Rosenthal, MD Center for [employer], [facility]

## 2019-06-06 ENCOUNTER — Telehealth (HOSPITAL_COMMUNITY): Payer: Self-pay | Admitting: *Deleted

## 2019-06-06 ENCOUNTER — Encounter (HOSPITAL_COMMUNITY): Payer: Self-pay | Admitting: *Deleted

## 2019-06-06 NOTE — Telephone Encounter (Signed)
Preadmission screen  

## 2019-06-11 ENCOUNTER — Encounter (HOSPITAL_COMMUNITY): Payer: Self-pay

## 2019-06-11 ENCOUNTER — Ambulatory Visit (HOSPITAL_COMMUNITY)
Admission: RE | Admit: 2019-06-11 | Discharge: 2019-06-11 | Disposition: A | Discharge status: Home / Self Care | Payer: Medicaid Other | Source: Ambulatory Visit | Attending: Obstetrics and Gynecology | Admitting: Obstetrics and Gynecology

## 2019-06-11 ENCOUNTER — Ambulatory Visit (HOSPITAL_COMMUNITY): Payer: Medicaid Other | Admitting: *Deleted

## 2019-06-11 ENCOUNTER — Other Ambulatory Visit: Payer: Self-pay | Admitting: Advanced Practice Midwife

## 2019-06-11 ENCOUNTER — Other Ambulatory Visit: Payer: Self-pay

## 2019-06-11 DIAGNOSIS — J45909 Unspecified asthma, uncomplicated: Secondary | ICD-10-CM | POA: Diagnosis present

## 2019-06-11 DIAGNOSIS — O99519 Diseases of the respiratory system complicating pregnancy, unspecified trimester: Secondary | ICD-10-CM | POA: Diagnosis present

## 2019-06-11 DIAGNOSIS — O99213 Obesity complicating pregnancy, third trimester: Secondary | ICD-10-CM | POA: Insufficient documentation

## 2019-06-11 DIAGNOSIS — O0990 Supervision of high risk pregnancy, unspecified, unspecified trimester: Secondary | ICD-10-CM | POA: Diagnosis present

## 2019-06-11 DIAGNOSIS — O09299 Supervision of pregnancy with other poor reproductive or obstetric history, unspecified trimester: Secondary | ICD-10-CM

## 2019-06-11 DIAGNOSIS — O99210 Obesity complicating pregnancy, unspecified trimester: Secondary | ICD-10-CM

## 2019-06-11 DIAGNOSIS — O24415 Gestational diabetes mellitus in pregnancy, controlled by oral hypoglycemic drugs: Secondary | ICD-10-CM | POA: Insufficient documentation

## 2019-06-11 DIAGNOSIS — Z3A38 38 weeks gestation of pregnancy: Secondary | ICD-10-CM | POA: Insufficient documentation

## 2019-06-11 DIAGNOSIS — O0993 Supervision of high risk pregnancy, unspecified, third trimester: Secondary | ICD-10-CM | POA: Insufficient documentation

## 2019-06-11 DIAGNOSIS — O09293 Supervision of pregnancy with other poor reproductive or obstetric history, third trimester: Secondary | ICD-10-CM | POA: Insufficient documentation

## 2019-06-11 DIAGNOSIS — O99513 Diseases of the respiratory system complicating pregnancy, third trimester: Secondary | ICD-10-CM | POA: Insufficient documentation

## 2019-06-13 ENCOUNTER — Other Ambulatory Visit: Payer: Self-pay

## 2019-06-13 ENCOUNTER — Ambulatory Visit (INDEPENDENT_AMBULATORY_CARE_PROVIDER_SITE_OTHER): Payer: Medicaid Other | Admitting: Obstetrics and Gynecology

## 2019-06-13 ENCOUNTER — Encounter: Payer: Self-pay | Admitting: Obstetrics and Gynecology

## 2019-06-13 VITALS — BP 126/84 | HR 90 | Wt 204.0 lb

## 2019-06-13 DIAGNOSIS — O0990 Supervision of high risk pregnancy, unspecified, unspecified trimester: Secondary | ICD-10-CM

## 2019-06-13 DIAGNOSIS — O26893 Other specified pregnancy related conditions, third trimester: Secondary | ICD-10-CM

## 2019-06-13 DIAGNOSIS — O24415 Gestational diabetes mellitus in pregnancy, controlled by oral hypoglycemic drugs: Secondary | ICD-10-CM

## 2019-06-13 DIAGNOSIS — D563 Thalassemia minor: Secondary | ICD-10-CM

## 2019-06-13 DIAGNOSIS — Z6791 Unspecified blood type, Rh negative: Secondary | ICD-10-CM

## 2019-06-13 DIAGNOSIS — O99519 Diseases of the respiratory system complicating pregnancy, unspecified trimester: Secondary | ICD-10-CM

## 2019-06-13 DIAGNOSIS — O99513 Diseases of the respiratory system complicating pregnancy, third trimester: Secondary | ICD-10-CM

## 2019-06-13 DIAGNOSIS — J45909 Unspecified asthma, uncomplicated: Secondary | ICD-10-CM

## 2019-06-13 DIAGNOSIS — Z3A38 38 weeks gestation of pregnancy: Secondary | ICD-10-CM

## 2019-06-13 DIAGNOSIS — O285 Abnormal chromosomal and genetic finding on antenatal screening of mother: Secondary | ICD-10-CM

## 2019-06-13 DIAGNOSIS — O0993 Supervision of high risk pregnancy, unspecified, third trimester: Secondary | ICD-10-CM

## 2019-06-13 NOTE — Progress Notes (Signed)
Subjective:  Crystal May is a 23 y.o. G2P0010 at [redacted]w[redacted]d being seen today for ongoing prenatal care.  She is currently monitored for the following issues for this high-risk pregnancy and has Supervision of high risk pregnancy, antepartum; Asthma affecting pregnancy, antepartum; History of miscarriage, currently pregnant; Rh negative state in antepartum period; Alpha thalassemia trait; Gestational diabetes; Obesity in pregnancy; and Abnormal fetal chromosomal analysis affecting antepartum care of mother on their problem list.  Patient reports general discomforts of pregnancy.  Contractions: Not present. Vag. Bleeding: None.  Movement: Present. Denies leaking of fluid.   The following portions of the patient's history were reviewed and updated as appropriate: allergies, current medications, past family history, past medical history, past social history, past surgical history and problem list. Problem list updated.  Objective:   Vitals:   06/13/19 1122  BP: 126/84  Pulse: 90  Weight: 204 lb (92.5 kg)    Fetal Status: Fetal Heart Rate (bpm): 136   Movement: Present     General:  Alert, oriented and cooperative. Patient is in no acute distress.  Skin: Skin is warm and dry. No rash noted.   Cardiovascular: Normal heart rate noted  Respiratory: Normal respiratory effort, no problems with respiration noted  Abdomen: Soft, gravid, appropriate for gestational age. Pain/Pressure: Present     Pelvic:  Cervical exam deferred        Extremities: Normal range of motion.  Edema: None  Mental Status: Normal mood and affect. Normal behavior. Normal judgment and thought content.   Urinalysis:      Assessment and Plan:  Pregnancy: G2P0010 at [redacted]w[redacted]d  1. Supervision of high risk pregnancy, antepartum Stable  2. Gestational diabetes mellitus (GDM) in third trimester controlled on oral hypoglycemic drug CBG's in goal range Out of supplies last week, have been refilled BPP 8/8 this week IOL next  week  3. Abnormal fetal chromosomal analysis affecting antepartum care of mother NIPS--low FF, declined f/u up  4. Rh negative state in antepartum period S/P Rhogam  5. Asthma affecting pregnancy, antepartum Stable  6. Alpha thalassemia trait Stable  Term labor symptoms and general obstetric precautions including but not limited to vaginal bleeding, contractions, leaking of fluid and fetal movement were reviewed in detail with the patient. Please refer to After Visit Summary for other counseling recommendations.  Return in about 4 weeks (around 07/11/2019) for 4 weeks PP visit from 06/19/2019.   Chancy Milroy, MD

## 2019-06-13 NOTE — Patient Instructions (Signed)

## 2019-06-16 ENCOUNTER — Other Ambulatory Visit: Payer: Self-pay

## 2019-06-16 ENCOUNTER — Other Ambulatory Visit (HOSPITAL_COMMUNITY)
Admission: RE | Admit: 2019-06-16 | Discharge: 2019-06-16 | Disposition: A | Discharge status: Home / Self Care | Payer: Medicaid Other | Source: Ambulatory Visit | Attending: Obstetrics and Gynecology | Admitting: Obstetrics and Gynecology

## 2019-06-16 ENCOUNTER — Other Ambulatory Visit (HOSPITAL_COMMUNITY): Payer: Self-pay | Admitting: Advanced Practice Midwife

## 2019-06-16 DIAGNOSIS — Z20828 Contact with and (suspected) exposure to other viral communicable diseases: Secondary | ICD-10-CM | POA: Insufficient documentation

## 2019-06-16 DIAGNOSIS — Z01812 Encounter for preprocedural laboratory examination: Secondary | ICD-10-CM | POA: Insufficient documentation

## 2019-06-16 LAB — SARS CORONAVIRUS 2 BY RT PCR (HOSPITAL ORDER, PERFORMED IN ~~LOC~~ HOSPITAL LAB): SARS Coronavirus 2: NEGATIVE

## 2019-06-16 NOTE — MAU Note (Signed)
Covid swab collected.PT tolerated well.Asymptomaatic

## 2019-06-18 ENCOUNTER — Inpatient Hospital Stay (HOSPITAL_COMMUNITY): Payer: Medicaid Other | Admitting: Anesthesiology

## 2019-06-18 ENCOUNTER — Inpatient Hospital Stay (HOSPITAL_COMMUNITY): Payer: Medicaid Other

## 2019-06-18 ENCOUNTER — Encounter (HOSPITAL_COMMUNITY): Payer: Self-pay | Admitting: *Deleted

## 2019-06-18 ENCOUNTER — Ambulatory Visit (HOSPITAL_COMMUNITY): Payer: Medicaid Other

## 2019-06-18 ENCOUNTER — Inpatient Hospital Stay (HOSPITAL_COMMUNITY)
Admission: AD | Admit: 2019-06-18 | Payer: Medicaid Other | Source: Home / Self Care | Admitting: Obstetrics and Gynecology

## 2019-06-18 ENCOUNTER — Inpatient Hospital Stay (HOSPITAL_COMMUNITY)
Admission: AD | Admit: 2019-06-18 | Discharge: 2019-06-21 | DRG: 806 | Disposition: A | Discharge status: Home / Self Care | Payer: Medicaid Other | Attending: Family Medicine | Admitting: Family Medicine

## 2019-06-18 ENCOUNTER — Other Ambulatory Visit: Payer: Self-pay

## 2019-06-18 DIAGNOSIS — Z3A39 39 weeks gestation of pregnancy: Secondary | ICD-10-CM | POA: Diagnosis not present

## 2019-06-18 DIAGNOSIS — O26893 Other specified pregnancy related conditions, third trimester: Secondary | ICD-10-CM | POA: Diagnosis present

## 2019-06-18 DIAGNOSIS — Z6791 Unspecified blood type, Rh negative: Secondary | ICD-10-CM | POA: Diagnosis not present

## 2019-06-18 DIAGNOSIS — O24113 Pre-existing diabetes mellitus, type 2, in pregnancy, third trimester: Secondary | ICD-10-CM | POA: Diagnosis not present

## 2019-06-18 DIAGNOSIS — E669 Obesity, unspecified: Secondary | ICD-10-CM | POA: Diagnosis present

## 2019-06-18 DIAGNOSIS — O26899 Other specified pregnancy related conditions, unspecified trimester: Secondary | ICD-10-CM

## 2019-06-18 DIAGNOSIS — O2412 Pre-existing diabetes mellitus, type 2, in childbirth: Secondary | ICD-10-CM | POA: Diagnosis present

## 2019-06-18 DIAGNOSIS — O24425 Gestational diabetes mellitus in childbirth, controlled by oral hypoglycemic drugs: Principal | ICD-10-CM | POA: Diagnosis present

## 2019-06-18 DIAGNOSIS — J45909 Unspecified asthma, uncomplicated: Secondary | ICD-10-CM | POA: Diagnosis present

## 2019-06-18 DIAGNOSIS — O99210 Obesity complicating pregnancy, unspecified trimester: Secondary | ICD-10-CM

## 2019-06-18 DIAGNOSIS — O99214 Obesity complicating childbirth: Secondary | ICD-10-CM | POA: Diagnosis present

## 2019-06-18 DIAGNOSIS — O09299 Supervision of pregnancy with other poor reproductive or obstetric history, unspecified trimester: Secondary | ICD-10-CM

## 2019-06-18 DIAGNOSIS — O0990 Supervision of high risk pregnancy, unspecified, unspecified trimester: Secondary | ICD-10-CM

## 2019-06-18 DIAGNOSIS — O9952 Diseases of the respiratory system complicating childbirth: Secondary | ICD-10-CM | POA: Diagnosis present

## 2019-06-18 DIAGNOSIS — O24419 Gestational diabetes mellitus in pregnancy, unspecified control: Secondary | ICD-10-CM

## 2019-06-18 DIAGNOSIS — E119 Type 2 diabetes mellitus without complications: Secondary | ICD-10-CM | POA: Diagnosis not present

## 2019-06-18 LAB — TYPE AND SCREEN
ABO/RH(D): O NEG
Antibody Screen: NEGATIVE

## 2019-06-18 LAB — CBC
HCT: 38.8 % (ref 36.0–46.0)
Hemoglobin: 12.2 g/dL (ref 12.0–15.0)
MCH: 23.2 pg — ABNORMAL LOW (ref 26.0–34.0)
MCHC: 31.4 g/dL (ref 30.0–36.0)
MCV: 73.8 fL — ABNORMAL LOW (ref 80.0–100.0)
Platelets: 270 10*3/uL (ref 150–400)
RBC: 5.26 MIL/uL — ABNORMAL HIGH (ref 3.87–5.11)
RDW: 17.8 % — ABNORMAL HIGH (ref 11.5–15.5)
WBC: 9.6 10*3/uL (ref 4.0–10.5)
nRBC: 0 % (ref 0.0–0.2)

## 2019-06-18 LAB — GLUCOSE, CAPILLARY
Glucose-Capillary: 94 mg/dL (ref 70–99)
Glucose-Capillary: 64 mg/dL — ABNORMAL LOW (ref 70–99)
Glucose-Capillary: 65 mg/dL — ABNORMAL LOW (ref 70–99)
Glucose-Capillary: 88 mg/dL (ref 70–99)
Glucose-Capillary: 83 mg/dL (ref 70–99)

## 2019-06-18 LAB — RPR: RPR Ser Ql: NONREACTIVE

## 2019-06-18 MED ORDER — TERBUTALINE SULFATE 1 MG/ML IJ SOLN: 0.2500 mg | Freq: Once | INTRAMUSCULAR | Status: DC | PRN

## 2019-06-18 MED ORDER — LIDOCAINE HCL (PF) 1 % IJ SOLN
30.0000 mL | INTRAMUSCULAR | Status: AC | PRN
  Administered 2019-06-19: 09:00:00 30 mL via SUBCUTANEOUS
  Filled 2019-06-18: qty 30

## 2019-06-18 MED ORDER — FENTANYL-BUPIVACAINE-NACL 0.5-0.125-0.9 MG/250ML-% EP SOLN
12.0000 mL/h | EPIDURAL | Status: DC | PRN
  Filled 2019-06-18: qty 250

## 2019-06-18 MED ORDER — OXYTOCIN 40 UNITS IN NORMAL SALINE INFUSION - SIMPLE MED
2.5000 [IU]/h | INTRAVENOUS | Status: DC
  Filled 2019-06-18: qty 1000

## 2019-06-18 MED ORDER — MISOPROSTOL 25 MCG QUARTER TABLET
25.0000 ug | ORAL_TABLET | ORAL | Status: DC | PRN
  Administered 2019-06-18: 11:00:00 25 ug via VAGINAL
  Filled 2019-06-18: qty 1

## 2019-06-18 MED ORDER — SODIUM CHLORIDE (PF) 0.9 % IJ SOLN
INTRAMUSCULAR | Status: DC | PRN
  Administered 2019-06-18: 12 mL/h via EPIDURAL

## 2019-06-18 MED ORDER — ACETAMINOPHEN 325 MG PO TABS
650.0000 mg | ORAL_TABLET | ORAL | Status: DC | PRN
  Administered 2019-06-19: 03:00:00 650 mg via ORAL
  Filled 2019-06-18: qty 2

## 2019-06-18 MED ORDER — LIDOCAINE HCL (PF) 1 % IJ SOLN
INTRAMUSCULAR | Status: DC | PRN
  Administered 2019-06-18: 10 mL via EPIDURAL

## 2019-06-18 MED ORDER — ONDANSETRON HCL 4 MG/2ML IJ SOLN
4.0000 mg | Freq: Four times a day (QID) | INTRAMUSCULAR | Status: DC | PRN
  Administered 2019-06-19: 06:00:00 4 mg via INTRAVENOUS
  Filled 2019-06-18: qty 2

## 2019-06-18 MED ORDER — OXYCODONE-ACETAMINOPHEN 5-325 MG PO TABS: 2.0000 | ORAL_TABLET | ORAL | Status: DC | PRN

## 2019-06-18 MED ORDER — HYDROXYZINE HCL 50 MG PO TABS: 50.0000 mg | ORAL_TABLET | Freq: Four times a day (QID) | ORAL | Status: DC | PRN

## 2019-06-18 MED ORDER — LACTATED RINGERS IV SOLN
INTRAVENOUS | Status: DC
  Administered 2019-06-18 – 2019-06-19 (×4): via INTRAVENOUS

## 2019-06-18 MED ORDER — EPHEDRINE 5 MG/ML INJ: 10.0000 mg | INTRAVENOUS | Status: DC | PRN

## 2019-06-18 MED ORDER — SOD CITRATE-CITRIC ACID 500-334 MG/5ML PO SOLN: 30.0000 mL | ORAL | Status: DC | PRN

## 2019-06-18 MED ORDER — PHENYLEPHRINE 40 MCG/ML (10ML) SYRINGE FOR IV PUSH (FOR BLOOD PRESSURE SUPPORT): 80.0000 ug | PREFILLED_SYRINGE | INTRAVENOUS | Status: DC | PRN

## 2019-06-18 MED ORDER — FENTANYL CITRATE (PF) 100 MCG/2ML IJ SOLN
50.0000 ug | INTRAMUSCULAR | Status: DC | PRN
  Administered 2019-06-18 (×2): 100 ug via INTRAVENOUS
  Filled 2019-06-18 (×2): qty 2

## 2019-06-18 MED ORDER — OXYCODONE-ACETAMINOPHEN 5-325 MG PO TABS: 1.0000 | ORAL_TABLET | ORAL | Status: DC | PRN

## 2019-06-18 MED ORDER — FLEET ENEMA 7-19 GM/118ML RE ENEM: 1.0000 | ENEMA | RECTAL | Status: DC | PRN

## 2019-06-18 MED ORDER — LACTATED RINGERS IV SOLN
500.0000 mL | Freq: Once | INTRAVENOUS | Status: AC
  Administered 2019-06-18: 20:00:00 500 mL via INTRAVENOUS

## 2019-06-18 MED ORDER — OXYTOCIN BOLUS FROM INFUSION
500.0000 mL | Freq: Once | INTRAVENOUS | Status: AC
  Administered 2019-06-19: 08:00:00 500 mL via INTRAVENOUS

## 2019-06-18 MED ORDER — DIPHENHYDRAMINE HCL 50 MG/ML IJ SOLN: 12.5000 mg | INTRAMUSCULAR | Status: DC | PRN

## 2019-06-18 MED ORDER — LACTATED RINGERS IV SOLN
500.0000 mL | INTRAVENOUS | Status: DC | PRN
  Administered 2019-06-18 – 2019-06-19 (×3): 500 mL via INTRAVENOUS

## 2019-06-18 NOTE — H&P (Signed)
Crystal May is a 23 y.o. female G2P0010 @[redacted]w[redacted]d  pt of [place] presenting for IOL for A2DM on Metformin.     Nursing Staff Provider  Office Location  [place] Dating   certain LMP  Language  english Anatomy US   normal female> follow up for anatomy scan ordered  Flu Vaccine  Declined Genetic Screen  NIPS: low FF  AFP:   May Screen:  Quad:    TDaP vaccine   04/01/2019 Hgb A1C or  GTT Early  Third trimester: GDM  Rhogam   04-01-2019   LAB RESULTS   Feeding Plan Breast Blood Type --/--/O NEG (08/11 1440)   Contraception declines Antibody NEG (08/11 1440)  Circumcision Yes Rubella   Immune  Pediatrician  List given RPR   NR  Support Person Daquan Gilliam HBsAg   Neg  Prenatal Classes  HIV  NR  BTL Consent  GBS   negative  VBAC Consent  Pap n/a    Hgb Electro   alpha thalassemia trait    CF   neg    SMA   ngeg    Waterbirth  [ ]  Class [ ]  Consent [ ]  CNM visit   OB History    Gravida  2   Para      Term      Preterm      AB  1   Living        SAB  1   TAB      Ectopic      Multiple      Live Births             Past Medical History:  Diagnosis Date  . Anemia in pregnancy 04/30/2019     CBC Latest Ref Rng & Units 04/01/2019 12/31/2018 05/08/2018 WBC 3.4 - 10.8 x10E3/uL 8.5 8.8 6.6 Hemoglobin 11.1 - 15.9 g/dL 10.7(L) 12.0 11.5 Hematocrit 34.0 - 46.6 % 33.6(L) 36.8 34.8 Platelets 150 - 450 x10E3/uL 214 246 276    . Asthma   . Gestational diabetes   . Medical history non-contributory   . Seasonal allergies    Past Surgical History:  Procedure Laterality Date  . KNEE SURGERY     Family History: family history includes Healthy in her father and mother. Social History:  reports that she is a non-smoker but has been exposed to tobacco smoke. She has never used smokeless tobacco. She reports that she does not drink alcohol or use drugs.     Maternal Diabetes: Yes:  Diabetes Type:  Insulin/Medication controlled Genetic Screening: Abnormal:  Results:  Other: Maternal Ultrasounds/Referrals: Normal Fetal Ultrasounds or other Referrals:  None Maternal Substance Abuse:  No Significant Maternal Medications:  Meds include: Other:  Significant Maternal Lab Results:  Group B Strep negative Other Comments:  NIPS testing with low FF, pt taking Metformin  Review of Systems  Constitutional: Negative for chills, fever and malaise/fatigue.  Eyes: Negative for blurred vision.  Respiratory: Negative for cough and shortness of breath.   Cardiovascular: Negative for chest pain.  Gastrointestinal: Negative for heartburn and vomiting.  Genitourinary: Negative for dysuria, frequency and urgency.  Musculoskeletal: Negative.   Neurological: Negative for dizziness and headaches.  Psychiatric/Behavioral: Negative for depression.   Maternal Medical History:  Reason for admission: A2DM   Contractions: Frequency: irregular.   Perceived severity is mild.    Fetal activity: Perceived fetal activity is normal.   Last perceived fetal movement was within the past hour.    Prenatal complications: no prenatal  complications Prenatal Complications - Diabetes: gestational. Diabetes is managed by oral agent (monotherapy).      Dilation: 5.5 Effacement (%): 90 Station: -2 Exam by:: m wilkins rnc Blood pressure 124/65, pulse 98, temperature 98 F (36.7 C), temperature source Oral, resp. rate 18, height 5\' 2"  (1.575 m), weight 92.5 kg, last menstrual period 09/18/2018, unknown if currently breastfeeding. Maternal Exam:  Uterine Assessment: Contraction strength is mild.  Contraction frequency is irregular.   Abdomen: Fetal presentation: vertex  Cervix: Cervix evaluated by digital exam.     Fetal Exam Fetal Monitor Review: Mode: ultrasound.   Baseline rate: 135.  Variability: moderate (6-25 bpm).   Pattern: accelerations present and no decelerations.    Fetal State Assessment: Category I - tracings are normal.     Physical Exam  Nursing note and  vitals reviewed. Constitutional: She is oriented to person, place, and time. She appears well-developed and well-nourished.  Neck: Normal range of motion.  Cardiovascular: Normal rate, regular rhythm and normal heart sounds.  Respiratory: Effort normal and breath sounds normal.  GI: Soft.  Musculoskeletal: Normal range of motion.  Neurological: She is alert and oriented to person, place, and time.  Skin: Skin is warm and dry.  Psychiatric: She has a normal mood and affect. Her behavior is normal. Judgment and thought content normal.    Prenatal labs: ABO, Rh: --/--/O NEG (10/07 1049) Antibody: NEG (10/07 1049) Rubella: 2.08 (04/21 1424) RPR: NON REACTIVE (10/07 1049)  HBsAg: Negative (04/21 1424)  HIV: Non Reactive (07/21 09-23-2005)  GBS: Negative/-- (09/16 1455)   Assessment/Plan: G2P0010 at [redacted]w[redacted]d admitted for IOL for A2DM on Metformin GBS negative  Admit to Labor and Delivery Cytotec PV 25 mcg Q 4  IV pain medications PRN Anticipate NSVD   [redacted]w[redacted]d 06/18/2019, 7:47 PM

## 2019-06-18 NOTE — Progress Notes (Signed)
Hypoglycemic Event  CBG: 65  Treatment: 4oz ginger ale  Symptoms: asymptomatic  Follow-up CBG: Time: 1940 CBG Result:88   Ishmael Holter

## 2019-06-18 NOTE — Progress Notes (Signed)
Crystal May is a 23 y.o. G2P0010 at [redacted]w[redacted]d admitted for IOL for A2DM on Metformin  Subjective: Reports feeling comfortable with epidural in place. Spouse supportive at bedside.  Objective: Vitals:   06/18/19 2055 06/18/19 2100 06/18/19 2105 06/18/19 2110  BP: 135/77 (!) 145/87 (!) 143/77 137/78  Pulse: 91 (!) 117 88 95  Resp:      Temp:      TempSrc:      SpO2: 100% 100% 99% 100%  Weight:      Height:        No intake/output data recorded. No intake/output data recorded.   FHT:  FHR: 130 bpm, variability: moderate,  accelerations:  Present,  decelerations:  Absent UC:   regular, every 1.5-3 minutes SVE:  Dilation: 7 Effacement: 80% Station: -1 Exam by: Theadora Rama, SNM  Labs:   Recent Labs    06/18/19 1049  WBC 9.6  HGB 12.2  HCT 38.8  PLT 270    Assessment / Plan:  Labor: Induction of Labor due to A2DM. Foley balloon spontaneously expelled. No need for Pitocin augmentation at this time. Preeclampsia:  no signs or symptoms of toxicity and labs stable Fetal Wellbeing:  Category I Pain Control:  Epidural I/D:  GBS negative Anticipated MOD:  NSVD  [name], CNM, BSN 06/18/2019, 9:13 PM

## 2019-06-18 NOTE — Progress Notes (Signed)
Crystal May is a 23 y.o. G2P0010 at [redacted]w[redacted]d admitted for induction of labor due to Gestational diabetes.  Subjective: Pt doing well, mild irregular contractions, s/o in room for support.  Objective: BP 127/81   Pulse 82   Temp 98.2 F (36.8 C)   Resp 18   Ht 5\' 2"  (1.575 m)   Wt 92.5 kg   LMP 09/18/2018 (Exact Date) Comment: Previous miscarriage  BMI 37.30 kg/m  No intake/output data recorded. No intake/output data recorded.  FHT:  FHR: 135 bpm, variability: moderate,  accelerations:  Present,  decelerations:  Absent UC:   irregular SVE:   Dilation: 2 Effacement (%): 50 Station: -2 Exam by:: leftwich kirby Foley  Bulb placed without difficulty, pt tolerated well.  RN filled to 60 ml with normal saline.  Labs: Lab Results  Component Value Date   WBC 9.6 06/18/2019   HGB 12.2 06/18/2019   HCT 38.8 06/18/2019   MCV 73.8 (L) 06/18/2019   PLT 270 06/18/2019    Assessment / Plan: Induction of labor due to gestational diabetes  Labor: Progressing normally Preeclampsia:  n/a Fetal Wellbeing:  Category I Pain Control:  Labor support without medications I/D:  GBS neg Anticipated MOD:  NSVD  [name] 06/18/2019, 5:04 PM

## 2019-06-18 NOTE — Anesthesia Procedure Notes (Signed)
Epidural Patient location during procedure: OB Start time: 06/18/2019 8:37 PM End time: 06/18/2019 8:47 PM  Staffing Anesthesiologist: Lidia Collum, MD Performed: anesthesiologist   Preanesthetic Checklist Completed: patient identified, pre-op evaluation, timeout performed, IV checked, risks and benefits discussed and monitors and equipment checked  Epidural Patient position: sitting Prep: DuraPrep Patient monitoring: heart rate, continuous pulse ox and blood pressure Approach: midline Location: L3-L4 Injection technique: LOR air  Needle:  Needle type: Tuohy  Needle gauge: 17 G Needle length: 9 cm Needle insertion depth: 5 cm Catheter type: closed end flexible Catheter size: 19 Gauge Catheter at skin depth: 10 cm Test dose: negative  Assessment Events: blood not aspirated, injection not painful, no injection resistance, negative IV test and no paresthesia  Additional Notes Reason for block:procedure for pain

## 2019-06-18 NOTE — Anesthesia Preprocedure Evaluation (Addendum)
Anesthesia Evaluation  Patient identified by MRN, date of birth, ID band Patient awake    Reviewed: Allergy & Precautions, H&P , NPO status , Patient's Chart, lab work & pertinent test results  History of Anesthesia Complications Negative for: history of anesthetic complications  Airway Mallampati: II  TM Distance: >3 FB Neck ROM: full    Dental no notable dental hx.    Pulmonary asthma ,    Pulmonary exam normal        Cardiovascular negative cardio ROS Normal cardiovascular exam Rhythm:regular Rate:Normal     Neuro/Psych negative neurological ROS  negative psych ROS   GI/Hepatic negative GI ROS, Neg liver ROS,   Endo/Other  diabetes, Gestational  Renal/GU negative Renal ROS  negative genitourinary   Musculoskeletal   Abdominal   Peds  Hematology negative hematology ROS (+)   Anesthesia Other Findings   Reproductive/Obstetrics (+) Pregnancy                            Anesthesia Physical Anesthesia Plan  ASA: II  Anesthesia Plan: Epidural   Post-op Pain Management:    Induction:   PONV Risk Score and Plan:   Airway Management Planned:   Additional Equipment:   Intra-op Plan:   Post-operative Plan:   Informed Consent: I have reviewed the patients History and Physical, chart, labs and discussed the procedure including the risks, benefits and alternatives for the proposed anesthesia with the patient or authorized representative who has indicated his/her understanding and acceptance.       Plan Discussed with:   Anesthesia Plan Comments:         Anesthesia Quick Evaluation

## 2019-06-19 ENCOUNTER — Encounter (HOSPITAL_COMMUNITY): Payer: Self-pay | Admitting: *Deleted

## 2019-06-19 ENCOUNTER — Other Ambulatory Visit: Payer: Self-pay

## 2019-06-19 DIAGNOSIS — O2412 Pre-existing diabetes mellitus, type 2, in childbirth: Secondary | ICD-10-CM

## 2019-06-19 DIAGNOSIS — Z3A39 39 weeks gestation of pregnancy: Secondary | ICD-10-CM

## 2019-06-19 DIAGNOSIS — E119 Type 2 diabetes mellitus without complications: Secondary | ICD-10-CM

## 2019-06-19 LAB — GLUCOSE, CAPILLARY
Glucose-Capillary: 96 mg/dL (ref 70–99)
Glucose-Capillary: 90 mg/dL (ref 70–99)

## 2019-06-19 MED ORDER — BENZOCAINE-MENTHOL 20-0.5 % EX AERO
1.0000 "application " | INHALATION_SPRAY | CUTANEOUS | Status: DC | PRN
  Administered 2019-06-19 – 2019-06-21 (×2): 1 via TOPICAL
  Filled 2019-06-19 (×2): qty 56

## 2019-06-19 MED ORDER — COCONUT OIL OIL: 1.0000 "application " | TOPICAL_OIL | Status: DC | PRN

## 2019-06-19 MED ORDER — ONDANSETRON HCL 4 MG PO TABS: 4.0000 mg | ORAL_TABLET | ORAL | Status: DC | PRN

## 2019-06-19 MED ORDER — IBUPROFEN 600 MG PO TABS
600.0000 mg | ORAL_TABLET | Freq: Four times a day (QID) | ORAL | Status: DC
  Administered 2019-06-19 – 2019-06-21 (×9): 600 mg via ORAL
  Filled 2019-06-19 (×10): qty 1

## 2019-06-19 MED ORDER — PRENATAL MULTIVITAMIN CH
1.0000 | ORAL_TABLET | Freq: Every day | ORAL | Status: DC
  Administered 2019-06-19 – 2019-06-21 (×3): 1 via ORAL
  Filled 2019-06-19 (×3): qty 1

## 2019-06-19 MED ORDER — DIBUCAINE (PERIANAL) 1 % EX OINT: 1.0000 "application " | TOPICAL_OINTMENT | CUTANEOUS | Status: DC | PRN

## 2019-06-19 MED ORDER — OXYTOCIN 40 UNITS IN NORMAL SALINE INFUSION - SIMPLE MED
1.0000 m[IU]/min | INTRAVENOUS | Status: DC
  Administered 2019-06-19: 05:00:00 1 m[IU]/min via INTRAVENOUS

## 2019-06-19 MED ORDER — TERBUTALINE SULFATE 1 MG/ML IJ SOLN: 0.2500 mg | Freq: Once | INTRAMUSCULAR | Status: DC | PRN

## 2019-06-19 MED ORDER — ACETAMINOPHEN 325 MG PO TABS
650.0000 mg | ORAL_TABLET | ORAL | Status: DC | PRN
  Administered 2019-06-21: 06:00:00 650 mg via ORAL
  Filled 2019-06-19: qty 2

## 2019-06-19 MED ORDER — OXYTOCIN 40 UNITS IN NORMAL SALINE INFUSION - SIMPLE MED: 1.0000 m[IU]/min | INTRAVENOUS | Status: DC

## 2019-06-19 MED ORDER — DIPHENHYDRAMINE HCL 25 MG PO CAPS: 25.0000 mg | ORAL_CAPSULE | Freq: Four times a day (QID) | ORAL | Status: DC | PRN

## 2019-06-19 MED ORDER — ONDANSETRON HCL 4 MG/2ML IJ SOLN: 4.0000 mg | INTRAMUSCULAR | Status: DC | PRN

## 2019-06-19 MED ORDER — WITCH HAZEL-GLYCERIN EX PADS
1.0000 "application " | MEDICATED_PAD | CUTANEOUS | Status: DC | PRN
  Administered 2019-06-19: 1 via TOPICAL

## 2019-06-19 MED ORDER — SIMETHICONE 80 MG PO CHEW: 80.0000 mg | CHEWABLE_TABLET | ORAL | Status: DC | PRN

## 2019-06-19 MED ORDER — SENNOSIDES-DOCUSATE SODIUM 8.6-50 MG PO TABS
2.0000 | ORAL_TABLET | ORAL | Status: DC
  Administered 2019-06-19 – 2019-06-20 (×2): 2 via ORAL
  Filled 2019-06-19 (×2): qty 2

## 2019-06-19 NOTE — Progress Notes (Signed)
Kenadie Royce is a 23 y.o. G2P0010 at [redacted]w[redacted]d admitted for IOL for A2DM on Metformin  Subjective: Reports feeling comfortable with epidural in place. Has no complaints at this time. Spouse supportive at bedside.  Objective: Vitals:   06/19/19 0100 06/19/19 0130 06/19/19 0200 06/19/19 0230  BP: 131/83 131/62 131/66 117/75  Pulse: (!) 101 100 (!) 121 (!) 119  Resp:      Temp:      TempSrc:      SpO2:      Weight:      Height:        No intake/output data recorded. Total I/O In: -  Out: 425 [Urine:425]   FHT:  FHR: 135 bpm, variability: moderate,  accelerations:  Present,  decelerations:  Present occ variables with ctx UC:   regular, every 3-5.5 minutes SVE:   Dilation: 10 Effacement (%): 90 Station: -1 Exam by:: Abbygayle Helfand, SNM  Labs:   Recent Labs    06/18/19 1049  WBC 9.6  HGB 12.2  HCT 38.8  PLT 270    Assessment / Plan:  Labor: Progressing normally, is currently laboring down in High Fowlers position with legs crossed to facilitate fetal descent prior to pushing. Preeclampsia:  no signs or symptoms of toxicity and labs stable Fetal Wellbeing:  Category I Pain Control:  Epidural I/D:  GBS negative Anticipated MOD:  NSVD  [name], CNM, BSN 06/19/2019, 2:36 AM

## 2019-06-19 NOTE — Anesthesia Postprocedure Evaluation (Signed)
Anesthesia Post Note  Patient: Crystal May  Procedure(s) Performed: AN AD [place]     Patient location during evaluation: Mother Baby Anesthesia Type: Epidural Level of consciousness: awake and alert Pain management: pain level controlled Vital Signs Assessment: post-procedure vital signs reviewed and stable Respiratory status: spontaneous breathing, nonlabored ventilation and respiratory function stable Cardiovascular status: stable Postop Assessment: no headache, no backache and epidural receding Anesthetic complications: no    Last Vitals:  Vitals:   06/19/19 1115 06/19/19 1441  BP: 127/80 110/84  Pulse: 97 86  Resp: 12 16  Temp: 36.9 C 36.7 C  SpO2: 99% 100%    Last Pain:  Vitals:   06/19/19 1445  TempSrc:   PainSc: 9    Pain Goal:                   Kache Mcclurg

## 2019-06-19 NOTE — Progress Notes (Signed)
Crystal May is a 23 y.o. G2P0010 at [redacted]w[redacted]d admitted for IOL for A2DM on Metformin  Subjective: Reports feeling comfortable with epidural in place. Is currently pushing in left side lying position with RN and FOB supportive at bedside.  Objective: Vitals:   06/19/19 0300 06/19/19 0310 06/19/19 0330 06/19/19 0400  BP: 117/67  126/84 135/73  Pulse: (!) 120  (!) 125 97  Resp:    20  Temp:  (!) 100.8 F (38.2 C)    TempSrc:  Axillary    SpO2: 100%  100% 100%  Weight:      Height:        No intake/output data recorded. Total I/O In: -  Out: 425 [Urine:425]   FHT:  FHR: 135 bpm, variability: moderate,  accelerations:  Present,  decelerations:  Absent UC:   regular, every 3-7 minutes SVE:   Dilation: 10 Effacement (%): 100 Station: 0 Exam by:: Crystal May, SNM  Labs:   Recent Labs    06/18/19 1049  WBC 9.6  HGB 12.2  HCT 38.8  PLT 270    Assessment / Plan:  Labor: Progressing normally, contractions have spaced out while pushing. Plan to stop pushing and start low-dose Pitocin augmentation to regulate contraction pattern and then resume pushing. Preeclampsia:  no signs or symptoms of toxicity and labs stable Fetal Wellbeing:  Category I Pain Control:  Epidural I/D:  GBS negative Anticipated MOD:  NSVD  Crystal May, CNM, BSN 06/19/2019, 4:29 AM

## 2019-06-19 NOTE — Lactation Note (Addendum)
This note was copied from a baby's chart. Lactation Consultation Note  Patient Name: Crystal May [id number] Date: 06/19/2019 Reason for consult: Follow-up assessment(referral by nurse) G2 P1 delivery.  Mom with GDM on metformin during pregnancy and anemic.  Mom reprots no tother problems with pregnancy.  Mom reports she did not notice breast growth with this pregnancy.  Did notice it with her first pregnancy that resulted in miscarriage. Mom reports she did have some breast and nipple tenderness first trimester. Feels she had some darkening of areola also. Infant not cuing.  Swaddled.  Assisted with feeding.  Infant would not wake to latch.  Mom with large breasts. Shape slightly tubular going towards the nipple.Nipples and areolas wnl/ both nipples evert well.  Unable to hand express at this time.  Left infant STS.  Urged STS to help with blood sugars and to keep infant at restaurant.  Urged mom to call lactation when infant cuing.  Maternal Data Formula Feeding for Exclusion: No Has patient been taught Hand Expression?: Yes Does the patient have breastfeeding experience prior to this delivery?: No  Feeding Feeding Type: Breast Fed  LATCH Score Latch: Too sleepy or reluctant, no latch achieved, no sucking elicited.  Audible Swallowing: None  Type of Nipple: Everted at rest and after stimulation  Comfort (Breast/Nipple): Soft / non-tender  Hold (Positioning): Assistance needed to correctly position infant at breast and maintain latch.  LATCH Score: 5  Interventions    Lactation Tools Discussed/Used     Consult Status Consult Status: Follow-up Date: 06/20/19 Follow-up type: In-patient    [facility] Caul 06/19/2019, 10:36 PM

## 2019-06-19 NOTE — Lactation Note (Signed)
This note was copied from a baby's chart. Lactation Consultation Note  Patient Name: Crystal May [id number] Date: 06/19/2019 Reason for consult: Follow-up assessment(referral by nurse) Did not hear from parents.LC stuck her head in room.  Mom reports she has not tried to feed since I was there last.  Asked if we could try.  Assist with unswaddling infant/taking gloves off and getting her STS with mom.  Infant started cuing slighty.  Opening some and scooting toward breast.  Assist with positioning/infant will open and just hld nipple in mouth.  She did this three times and feel back asleep.  Left STS.  Reviewed early feeding cues with parents.  Urged to call lactation as needed.  Maternal Data Formula Feeding for Exclusion: No Has patient been taught Hand Expression?: Yes Does the patient have breastfeeding experience prior to this delivery?: No  Feeding Feeding Type: Breast Fed  LATCH Score Latch: Repeated attempts needed to sustain latch, nipple held in mouth throughout feeding, stimulation needed to elicit sucking reflex.  Audible Swallowing: None  Type of Nipple: Everted at rest and after stimulation  Comfort (Breast/Nipple): Soft / non-tender  Hold (Positioning): Assistance needed to correctly position infant at breast and maintain latch.  LATCH Score: 6  Interventions Interventions: Breast feeding basics reviewed;Breast massage;Hand express  Lactation Tools Discussed/Used     Consult Status Consult Status: Follow-up Date: 06/20/19 Follow-up type: In-patient    Crystal May 06/19/2019, 11:24 PM

## 2019-06-19 NOTE — Discharge Summary (Signed)
Postpartum Discharge Summary      Patient Name: Crystal May DOB: [birth year] MRN: [id number]  Date of admission: 06/18/2019 Delivering Provider: Lin Givens   Date of discharge: 06/21/2019  Admitting diagnosis: Pregnancy  Intrauterine pregnancy: [redacted]w[redacted]d    Secondary diagnosis:  Active Problems:   Asthma affecting pregnancy, antepartum   Rh negative state in antepartum period   Obesity in pregnancy   GDM, class A2  Additional problems: none     Discharge diagnosis: Term Pregnancy Delivered and GDM A2                                                                                                Post partum procedures:none  Augmentation: Pitocin, Cytotec and Foley Balloon  Complications: None  Hospital course:  Induction of Labor With Vaginal Delivery   23y.o. yo G2P0010 at 363w1das admitted to the hospital 06/18/2019 for induction of labor.  Indication for induction: A2 DM.  Patient had a labor course significant for having cx ripening during the day on 10/7 and becoming complete by 0300 on 10/8. She had mat temp x 2 with FHR remaining within normal range and Cat 1, with total ROM time x 12 hrs prior to delivery. No dx of Triple I. Membrane Rupture Time/Date: 8:08 PM ,06/18/2019   Intrapartum Procedures: Episiotomy: None [1]                                         Lacerations:  Sulcus [9]  Patient had delivery of a Viable infant.  Information for the patient's newborn:  GoLusine, Corlett0[[id number]]Delivery Method: Vaginal, Spontaneous(Filed from Delivery Summary)    06/19/2019  Details of delivery can be found in separate delivery note.  Patient had a routine postpartum course. Patient is discharged home 06/21/19. Delivery time: 8:21 AM    Magnesium Sulfate received: No BMZ received: No Rhophylac:No- baby O neg also MMR:N/A Transfusion:No  Physical exam  Vitals:   06/20/19 0528 06/20/19 1325 06/20/19 2101 06/21/19 0630  BP: 123/81 123/71 112/76 128/83   Pulse: 72 92 97 96  Resp: _0 Temp: 98.6 F (37 C) 98.4 F (36.9 C) 98 F (36.7 C) 98.6 F (37 C)  TempSrc: Oral Axillary Axillary Oral  SpO2: 99% 100%    Weight:      Height:       General: alert and cooperative Lochia: appropriate Uterine Fundus: firm Incision: N/A DVT Evaluation: No evidence of DVT seen on physical exam. Labs: Lab Results  Component Value Date   WBC 9.6 06/18/2019   HGB 12.2 06/18/2019   HCT 38.8 06/18/2019   MCV 73.8 (L) 06/18/2019   PLT 270 06/18/2019   CMP Latest Ref Rng & Units 04/13/2018  Glucose 70 - 99 mg/dL 107(H)  BUN 6 - 20 mg/dL 9  Creatinine 0.44 - 1.00 mg/dL 0.59  Sodium 135 - 145 mmol/L 136  Potassium 3.5 - 5.1 mmol/L 3.9  Chloride 98 - 111 mmol/L 106  CO2 22 - 32 mmol/L 22  Calcium 8.9 - 10.3 mg/dL 9.4  Total Protein 6.5 - 8.1 g/dL 6.9  Total Bilirubin 0.3 - 1.2 mg/dL 0.3  Alkaline Phos 38 - 126 U/L 49  AST 15 - 41 U/L 16  ALT 0 - 44 U/L 15    Discharge instruction: per After Visit Summary and "Baby and Me Booklet".  After visit meds:  Allergies as of 06/21/2019   No Known Allergies     Medication List    TAKE these medications   ibuprofen 600 MG tablet Commonly known as: ADVIL Take 1 tablet (600 mg total) by mouth every 6 (six) hours as needed.       Diet: routine diet  Activity: Advance as tolerated. Pelvic rest for 6 weeks.   Outpatient follow up:4wks with GTT Follow up Appt:No future appointments. Follow up Visit:  Please schedule this patient for Postpartum visit in: 4 weeks with the following provider: Any provider For C/S patients schedule nurse incision check in weeks 2 weeks: no High risk pregnancy complicated by: GDM Delivery mode:  SVD Anticipated Birth Control:  declines PP Procedures needed: 2 hour GTT  Schedule Integrated BH visit: no   Newborn Data: Live born female  Birth Weight: 3340gm (7lb 5.8oz) APGAR: 63, 9  Newborn Delivery   Birth date/time: 06/19/2019 08:21:00 Delivery  type: Vaginal, Spontaneous      Baby Feeding: Bottle Disposition:home with mother   06/21/2019 Myrtis Ser, CNM  9:00 AM

## 2019-06-20 NOTE — Progress Notes (Signed)
POSTPARTUM PROGRESS NOTE  Post Partum Day 1  Subjective:  Crystal May is a 23 y.o. G2P1011 s/p SVD at [redacted]w[redacted]d.  She reports she is doing well. No acute events overnight. She denies any problems with ambulating, voiding or po intake. Denies nausea or vomiting.  Pain is well controlled.  Lochia is appropriate.  Objective: Blood pressure 123/81, pulse 72, temperature 98.6 F (37 C), temperature source Oral, resp. rate 18, height 5\' 2"  (1.575 m), weight 92.5 kg, last menstrual period 09/18/2018, SpO2 99 %, unknown if currently breastfeeding.  Physical Exam:  General: alert, cooperative and no distress Chest: no respiratory distress Heart:regular rate, distal pulses intact Abdomen: soft, nontender,  Uterine Fundus: firm, appropriately tender DVT Evaluation: No calf swelling or tenderness Extremities: No LE edema Skin: warm, dry  Recent Labs    06/18/19 1049  HGB 12.2  HCT 38.8    Assessment/Plan: Crystal May is a 23 y.o. G2P1011 s/p SVD at [redacted]w[redacted]d   PPD#1 - Doing well  Routine postpartum care Contraception: Declines  Feeding: Breast  Dispo: Plan for discharge PPD#2. Requests to stay another night.    LOS: 2 days   Phill Myron, D.O. OB Fellow  06/20/2019, 11:58 AM

## 2019-06-20 NOTE — Lactation Note (Signed)
This note was copied from a baby's chart. Lactation Consultation Note  Patient Name: Crystal May JJHER'D Date: 06/20/2019 Reason for consult: Follow-up assessment;Primapara;1st time breastfeeding;Term  LC asked by Maudie Mercury, RN to assist with feeding baby. RN unable to express colostrum with moderate hand expression and infant with no void in 24 hours. Desires to supplement with formula.  F/U visit with P1 mom who delivered @ 39wks, baby is now 49 hours old  LC entered room to find mom leaning to right side with infant latched to right breast in very relaxed cradle hold. Some distance noted between infant's mouth and breast tissue. Mom with large pendulous breasts but very little density to breast. Mom with medium size nipple that compresses and everts easily. With mom's permission, LC released infant's latch and attempted to latch again. Infant very sleepy and not opening mouth at all. Oral anatomy assessed and found to be WNL however infant with little attempts to latch onto LC gloved finger and biting more than suckling. LC performed hand expression with no results. Mom observed techniques. Reviewed different positions with mom of cradle vs. Football and mom agreeable to try football hold. Infant positioned at the breast on pillow and latch techniques reviewed with mom. Instructed to tickle infant's mouth with mom's nipple and wait for infant to open wide. Nose and chin should be touching breast during feeding and angle of infant's mouth should be as wide as possible. Instructed to have bottom lip touch the breast first and "rock" the baby on to the breast. Discussed with mom about supplementing at the breast and mom agreeable. Demonstrated use of 45Fr feeding tube at the breast and infant easily took 51ml formula while maintaining latch. Reviewed cleaning of 45Fr feeding tube and syringe. Reviewed burping of infant after feedings. Reinforced to mom that supplementation may only be temporary until  milk comes to volume.  Encouraged mom to use DEBP after each feeding every 3 hours for 20 minutes. DEBP setup and cleaning to be demonstrated by MB RN. Reviewed IP/OP lactation services and lactation brochure with phone number provided to parents. Encouraged to call with any further concerns.   Maternal Data Has patient been taught Hand Expression?: Yes Does the patient have breastfeeding experience prior to this delivery?: No  Feeding Feeding Type: Breast Milk with Formula added  LATCH Score Latch: Grasps breast easily, tongue down, lips flanged, rhythmical sucking.  Audible Swallowing: A few with stimulation  Type of Nipple: Everted at rest and after stimulation  Comfort (Breast/Nipple): Soft / non-tender  Hold (Positioning): Assistance needed to correctly position infant at breast and maintain latch.  LATCH Score: 8  Interventions Interventions: Breast feeding basics reviewed;Assisted with latch;Skin to skin;Breast massage;Hand express;Adjust position;Support pillows;Position options;DEBP  Lactation Tools Discussed/Used Tools: 45F feeding tube / Syringe;Supplemental Nutrition System WIC Program: Yes Initiated by:: MB RN Date initiated:: 06/20/19   Consult Status Consult Status: Follow-up Date: 06/21/19 Follow-up type: In-patient    Cranston Neighbor 06/20/2019, 10:19 AM

## 2019-06-21 MED ORDER — IBUPROFEN 600 MG PO TABS: 600.0000 mg | ORAL_TABLET | Freq: Four times a day (QID) | ORAL | 0 refills | Status: DC | PRN

## 2019-06-21 NOTE — Plan of Care (Signed)
  Problem: Education: Goal: Knowledge of General Education information will improve Description: Including pain rating scale, medication(s)/side effects and non-pharmacologic comfort measures Outcome: Completed/Met   Problem: Clinical Measurements: Goal: Ability to maintain clinical measurements within normal limits will improve Outcome: Completed/Met Goal: Diagnostic test results will improve Outcome: Completed/Met Goal: Respiratory complications will improve Outcome: Completed/Met Goal: Cardiovascular complication will be avoided Outcome: Completed/Met   Problem: Elimination: Goal: Will not experience complications related to bowel motility Outcome: Completed/Met   Problem: Education: Goal: Knowledge of condition will improve Outcome: Completed/Met   Problem: Activity: Goal: Will verbalize the importance of balancing activity with adequate rest periods Outcome: Completed/Met   Problem: Life Cycle: Goal: Chance of risk for complications during the postpartum period will decrease Outcome: Completed/Met   Problem: Role Relationship: Goal: Ability to demonstrate positive interaction with newborn will improve Outcome: Completed/Met

## 2019-06-21 NOTE — Lactation Note (Signed)
This note was copied from a baby's chart. Lactation Consultation Note:  Mother reports that she is formula feeding now because infant was not getting any milk from her breast.  Discussion about supply and demand. Mother reports that latching doesn't hurt and that infant latches without problems.   Discussed that milk coming to volume in a few days and that she will see flow of milk and she may change her mind about offering ebm.   Discussed treatment and prevention of engorgement.   Mother was given a harmony hand pump with instructions.  Mother was given support and encouragement . She was advised to page of phone [facility] services if needed. Mother is aware of available [facility] services and is active with [facility].  Patient Name: Girl Naje Rice [id number] Date: 06/21/2019 Reason for consult: Follow-up assessment   Maternal Data    Feeding    LATCH Score                   Interventions Interventions: Hand pump  Lactation Tools Discussed/Used     Consult Status Consult Status: Complete    Darla Lesches 06/21/2019, 9:40 AM

## 2019-06-21 NOTE — Discharge Instructions (Signed)

## 2019-07-29 ENCOUNTER — Other Ambulatory Visit: Payer: Self-pay | Admitting: *Deleted

## 2019-07-29 DIAGNOSIS — O24429 Gestational diabetes mellitus in childbirth, unspecified control: Secondary | ICD-10-CM

## 2019-07-30 ENCOUNTER — Telehealth: Payer: Self-pay | Admitting: Obstetrics and Gynecology

## 2019-07-30 NOTE — Telephone Encounter (Signed)
Attempted to call patient about her appointment on 11/19 @ 8:15. No answer left voicemail instructing patient to wear a face mask for the entire appointment and no visitors are allowed during the visit. Patient instructed not to attend the appointment if she was any symptoms. Symptom list and office number left. Patient instructed to come fasting.

## 2019-07-31 ENCOUNTER — Ambulatory Visit (INDEPENDENT_AMBULATORY_CARE_PROVIDER_SITE_OTHER): Payer: Medicaid Other | Admitting: Obstetrics and Gynecology

## 2019-07-31 ENCOUNTER — Other Ambulatory Visit: Payer: Self-pay

## 2019-07-31 ENCOUNTER — Other Ambulatory Visit: Payer: Medicaid Other

## 2019-07-31 ENCOUNTER — Encounter: Payer: Self-pay | Admitting: Obstetrics and Gynecology

## 2019-07-31 VITALS — BP 133/80 | HR 81 | Wt 184.0 lb

## 2019-07-31 DIAGNOSIS — O24415 Gestational diabetes mellitus in pregnancy, controlled by oral hypoglycemic drugs: Secondary | ICD-10-CM

## 2019-07-31 DIAGNOSIS — Z1389 Encounter for screening for other disorder: Secondary | ICD-10-CM | POA: Diagnosis not present

## 2019-07-31 DIAGNOSIS — O24429 Gestational diabetes mellitus in childbirth, unspecified control: Secondary | ICD-10-CM

## 2019-07-31 MED ORDER — NORGESTIMATE-ETH ESTRADIOL 0.25-35 MG-MCG PO TABS: 1.0000 | ORAL_TABLET | Freq: Every day | ORAL | 11 refills | Status: DC

## 2019-07-31 NOTE — Progress Notes (Signed)
Subjective:     Crystal May is a 23 y.o. female who presents for a postpartum visit. She is 6 weeks postpartum following a SVD. I have fully reviewed the prenatal and intrapartum course. The delivery was at [redacted]w[redacted]d  gestational weeks. Outcome: spontaneous vaginal delivery. Anesthesia: epidural. Postpartum course has been unremarkable. Baby's course has been unremarkable. Baby is feeding by bottle - Similac Neosure. Bleeding thin lochia. Bowel function is normal. Bladder function is normal. Patient is not sexually active. Contraception method is OCP (estrogen/progesterone). Postpartum depression screening: Negative  The following portions of the patient's history were reviewed and updated as appropriate: allergies, current medications, past family history, past medical history, past social history, past surgical history and problem list.  Review of Systems Pertinent items are noted in HPI.   Objective:    BP 133/80   Pulse 81   Wt 184 lb (83.5 kg)   BMI 33.65 kg/m   General:  alert, cooperative and appears stated age  Lungs: clear to auscultation bilaterally  Heart:  regular rate and rhythm, S1, S2 normal, no murmur, click, rub or gallop  Abdomen: soft, non-tender; bowel sounds normal; no masses,  no organomegaly   Assessment:   Normal postpartum exam. Pap smear not done at today's visit.   Plan:   1. Contraception: OCP (estrogen/progesterone) 2. 2 hour GTT today. 3. Follow up: @ age 40 for pap smear and annual exam.   Rexanne Inocencio, Artist Pais, NP 07/31/2019 8:52 AM

## 2019-08-01 LAB — GLUCOSE TOLERANCE, 2 HOURS
Glucose, 2 hour: 103 mg/dL (ref 65–139)
Glucose, GTT - Fasting: 89 mg/dL (ref 65–99)

## 2019-08-04 ENCOUNTER — Encounter: Payer: Self-pay | Admitting: *Deleted

## 2019-08-26 ENCOUNTER — Encounter: Payer: Self-pay | Admitting: General Practice

## 2019-12-25 IMAGING — US US FETAL BPP W/ NON-STRESS
1 series · 13 of 14 positions shown · non-contrast
Comparison: none

[Series 1: us fetal bpp w/nonstress · 14 acquisitions, 13 frames shown]
[im 1/14]
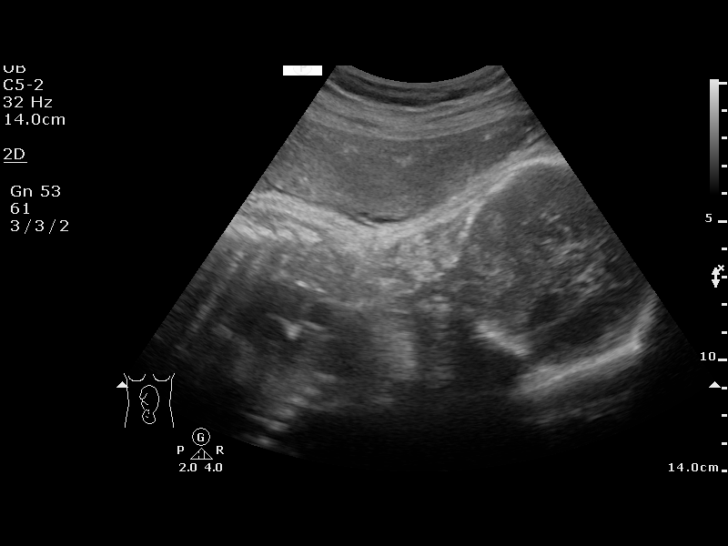
[im 2/14]
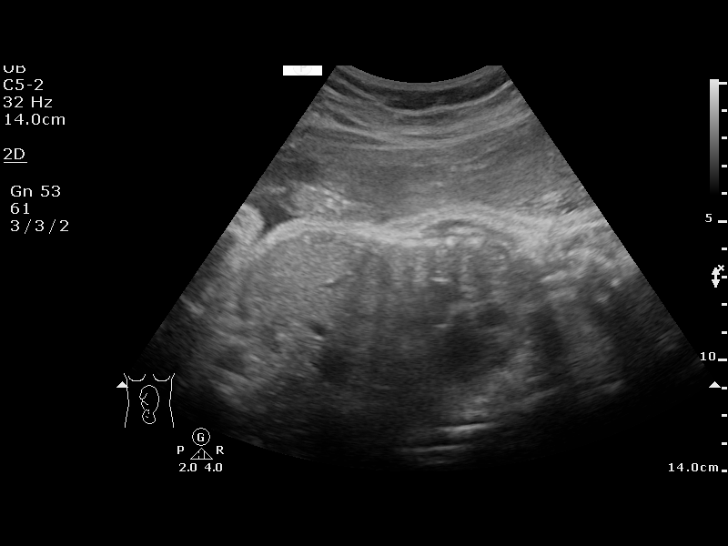
[im 3/14]
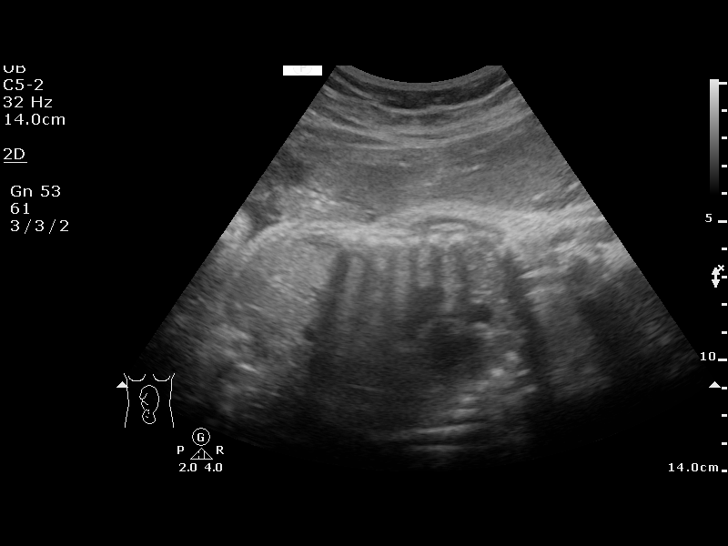
[im 4/14]
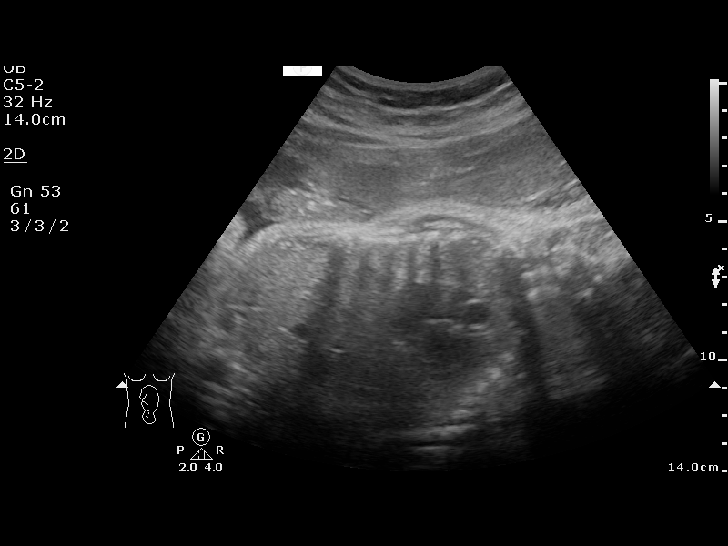
[im 5/14]
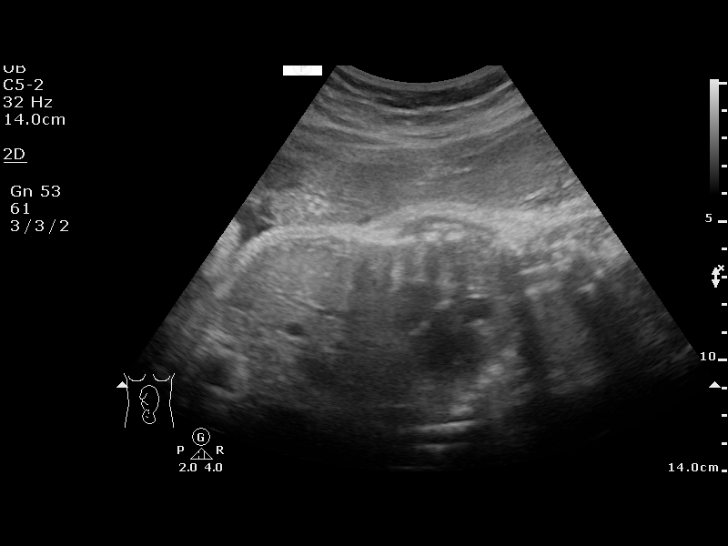
[im 6/14]
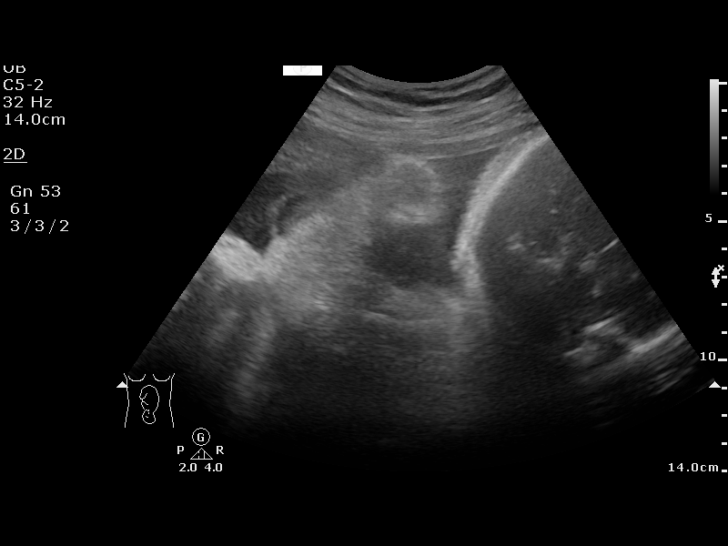
[im 8/14]
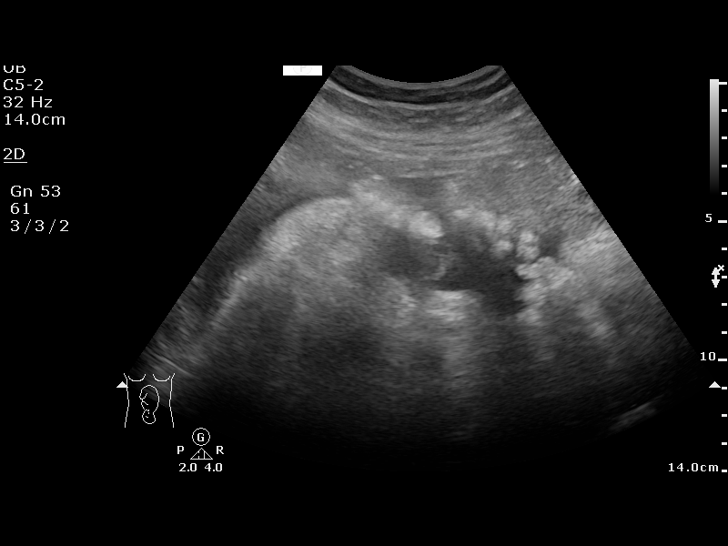
[im 9/14]
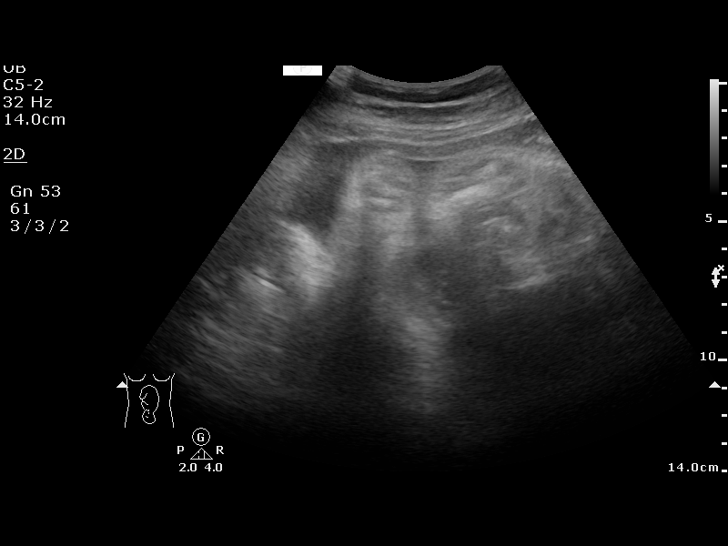
[im 10/14]
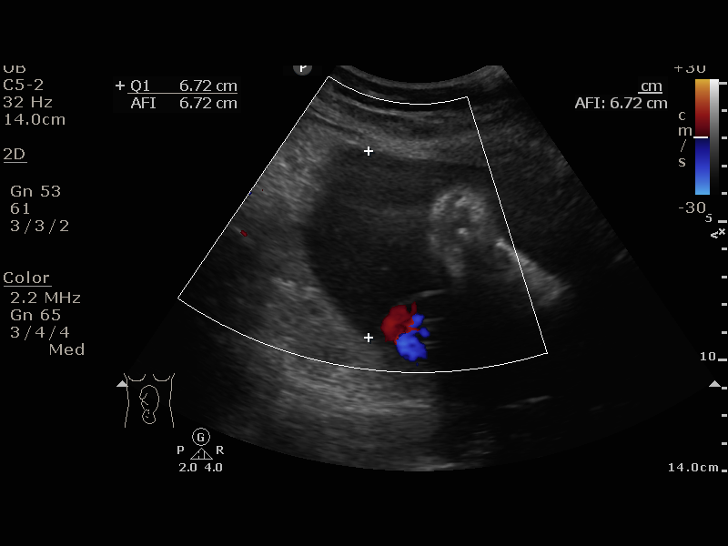
[im 11/14]
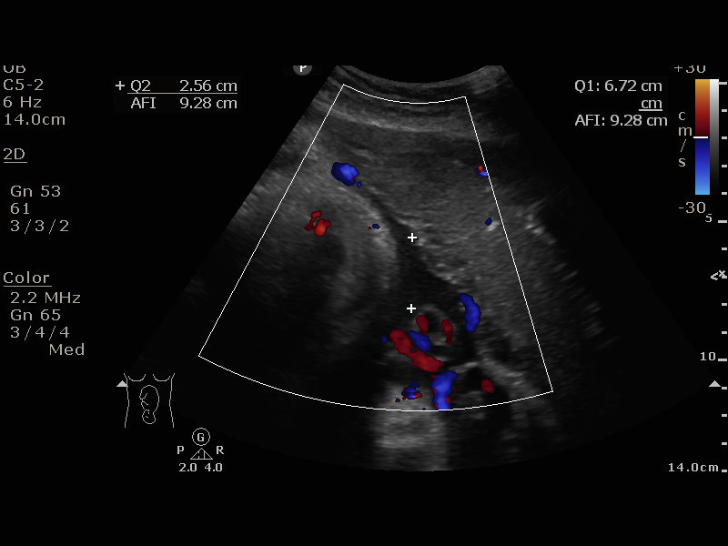
[im 12/14]
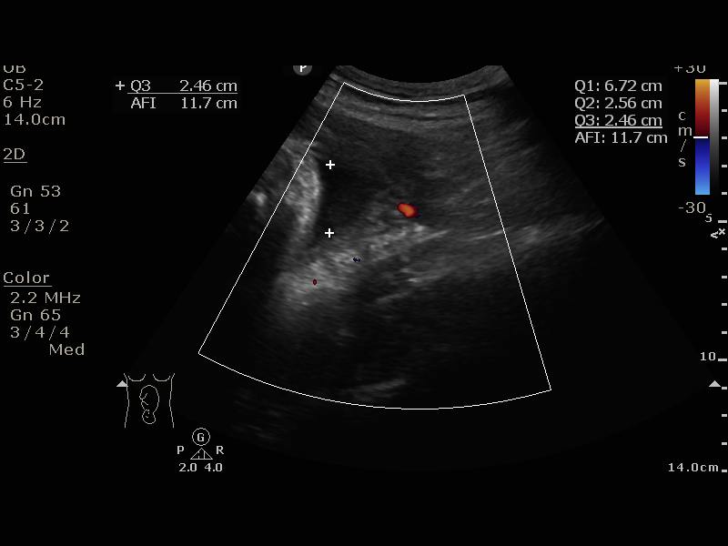
[im 13/14]
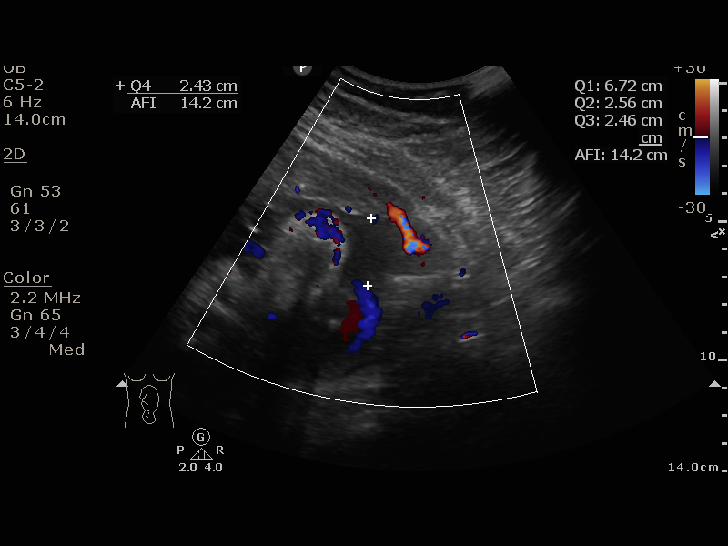
[im 14/14]
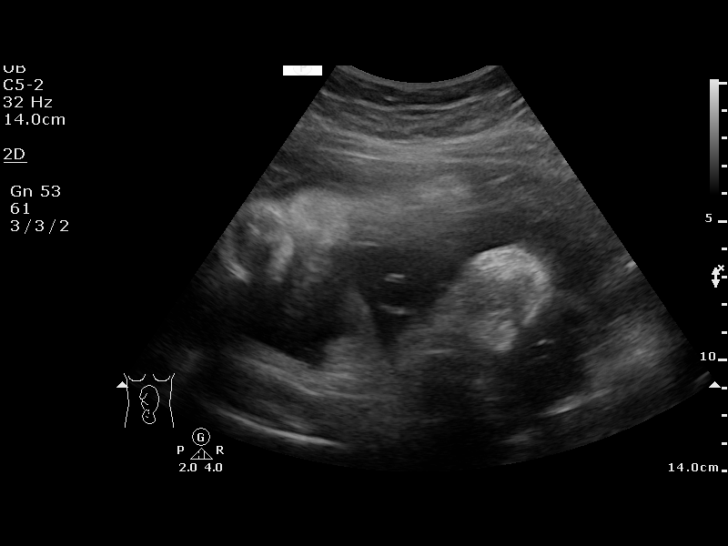

[13 of 14 positions shown; findings below may reference images not displayed]

[address]
                                                            Women's
                                                            [REDACTED]

  1  US FETAL BPP W/NONSTRESS             76818.4      KLEARCHOS LESSI
 ----------------------------------------------------------------------

 ----------------------------------------------------------------------
Service(s) Provided

 ----------------------------------------------------------------------
Indications

  34 weeks gestation of pregnancy
  Gestational diabetes in pregnancy,
  controlled by oral hypoglycemic drugs
 ----------------------------------------------------------------------
Vital Signs

                                                Height:        5'2"
Fetal Evaluation

 Num Of Fetuses:         1
 Preg. Location:         Intrauterine
 Cardiac Activity:       Observed
 Presentation:           Cephalic

 Amniotic Fluid
 AFI FV:      Within normal limits

 AFI Sum(cm)     %Tile       Largest Pocket(cm)
 14.17           50

 RUQ(cm)       RLQ(cm)       LUQ(cm)        LLQ(cm)

Biophysical Evaluation

 Amniotic F.V:   Pocket => 2 cm two         F. Tone:        Observed
                 planes
 F. Movement:    Observed                   N.S.T:          Reactive
 F. Breathing:   Observed                   Score:          [DATE]
OB History

 Gravidity:    2         Term:   0        Prem:   0        SAB:   1
 TOP:          0       Ectopic:  0        Living: 0
Gestational Age

 LMP:           34w 0d        Date:  09/18/18                 EDD:   06/25/19
 Best:          34w 0d     Det. By:  LMP  (09/18/18)          EDD:   06/25/19
Impression

 Reassuing antenatal testing
Recommendations

 Continue with weekly testing
                Erxleben, [place]

## 2020-01-15 IMAGING — US US MFM FETAL BPP W/O NON-STRESS
1 series · 9 of 9 positions shown · non-contrast
Comparison: none

[Series 1: us mfm fetal bpp w/o non-stress · 9 acquisitions, 9 frames shown]
[im 1/9]
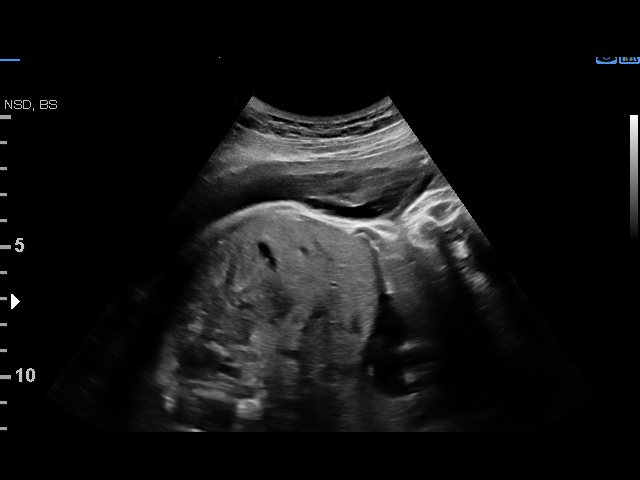
[im 2/9]
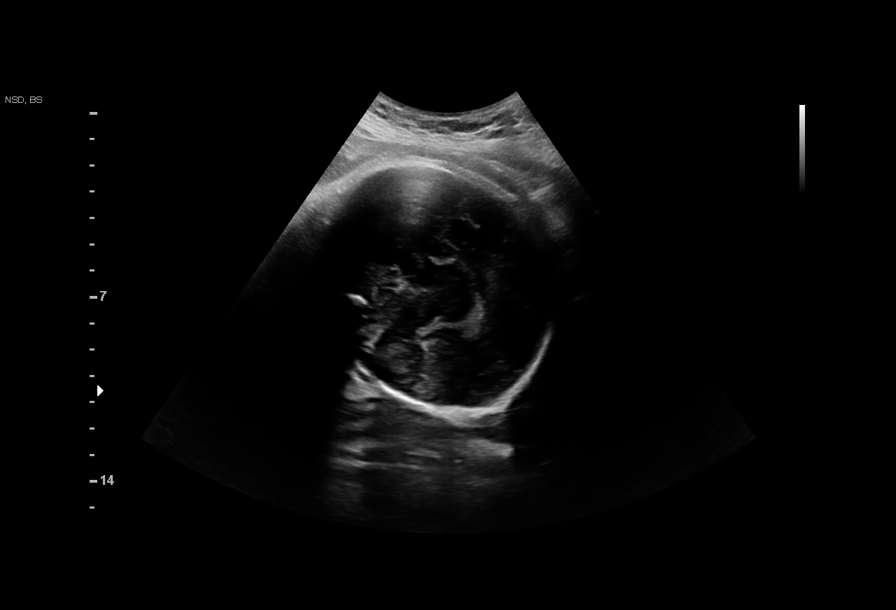
[im 3/9]
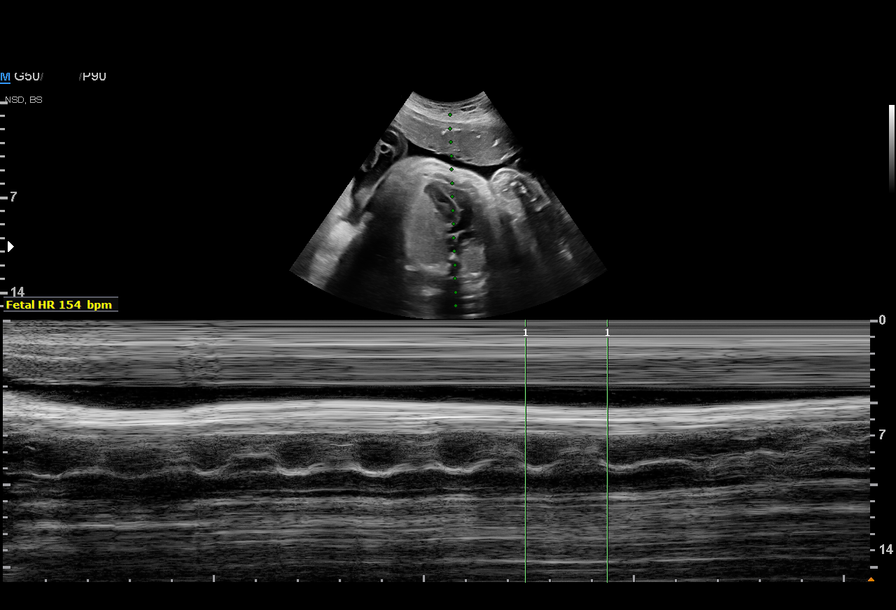
[im 4/9]
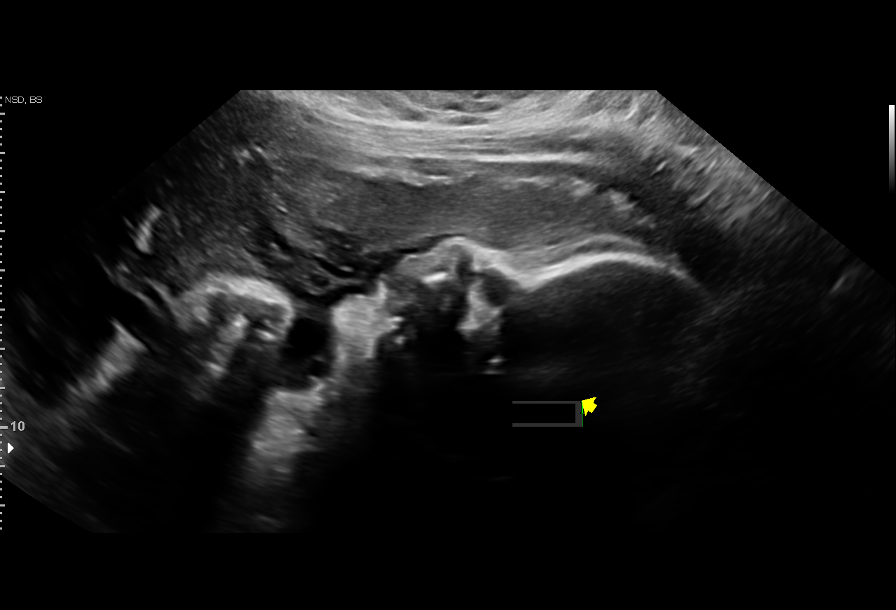
[im 5/9]
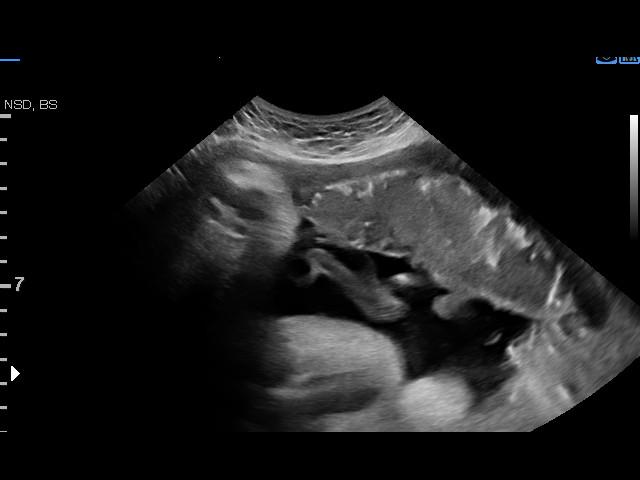
[im 6/9]
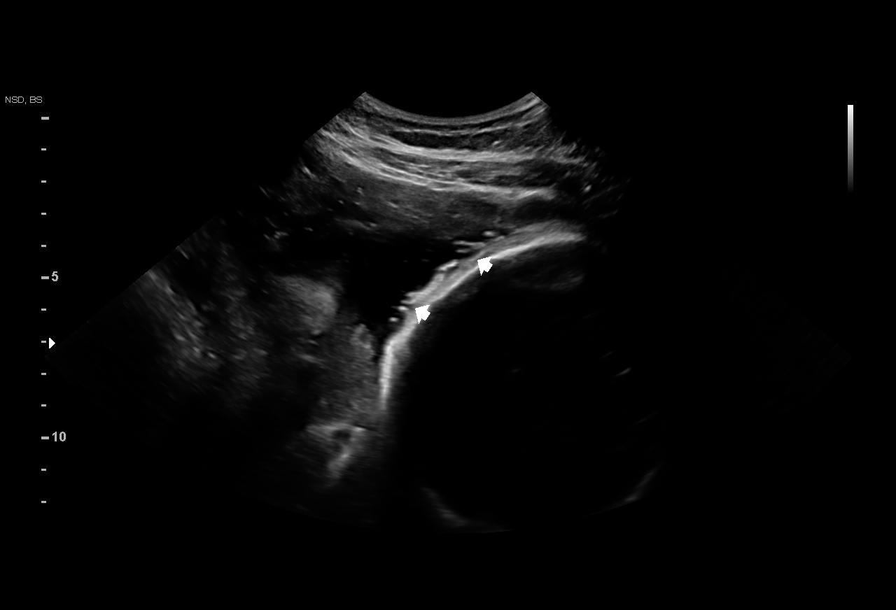
[im 7/9]
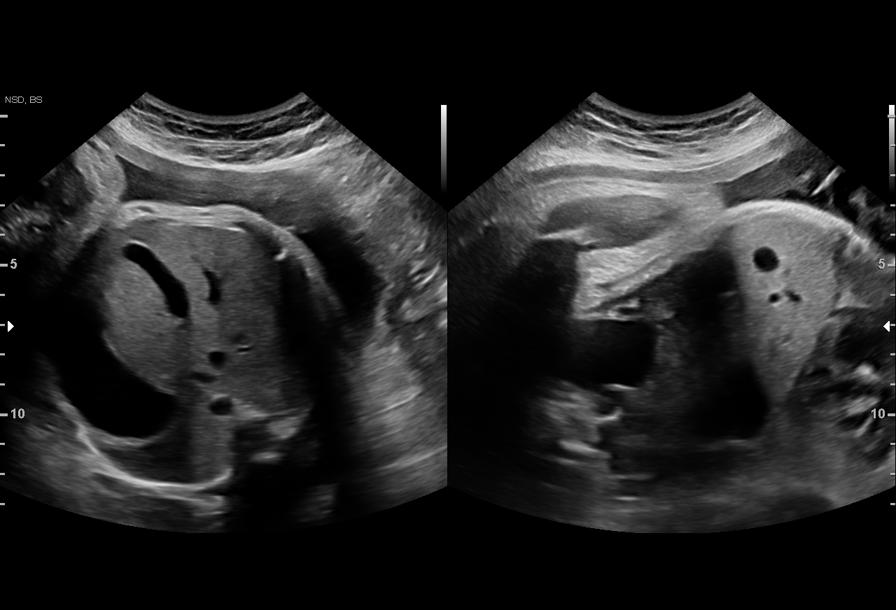
[im 8/9]
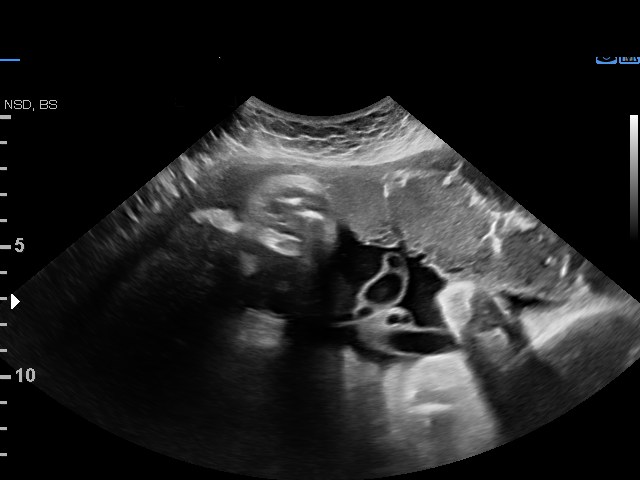
[im 9/9]
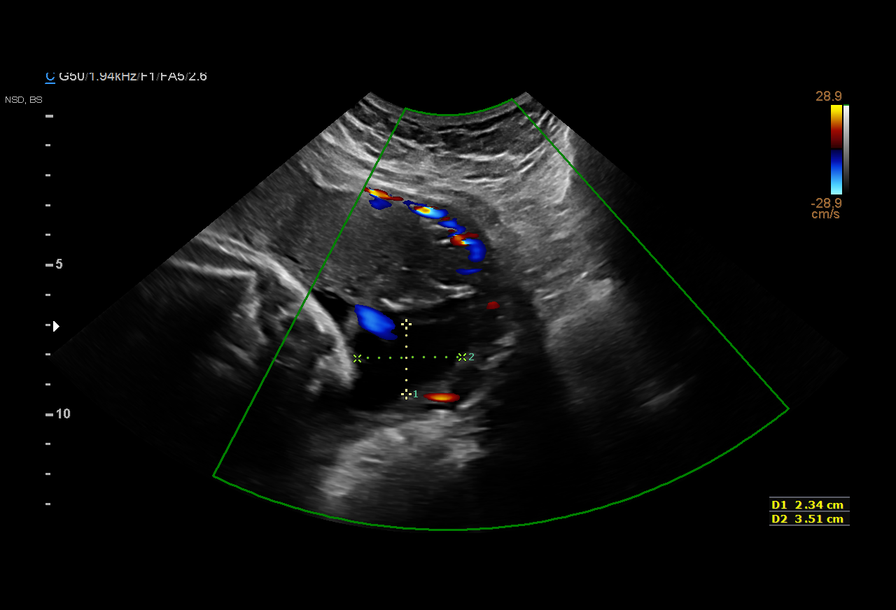

[9 of 9 positions shown; findings below may reference images not displayed]

[address]

 ----------------------------------------------------------------------

 ----------------------------------------------------------------------
Indications

  Gestational diabetes in pregnancy,
  controlled by oral hypoglycemic drugs
  37 weeks gestation of pregnancy
  Encounter for other specified antenatal
  screening
  Insufficient fetal DNA
 ----------------------------------------------------------------------
Vital Signs

                                                Height:        5'2"
Fetal Evaluation

 Num Of Fetuses:          1
 Fetal Heart Rate(bpm):   154
 Cardiac Activity:        Observed
 Presentation:            Cephalic

 Amniotic Fluid
 AFI FV:      Within normal limits

 AFI Sum(cm)     %Tile       Largest Pocket(cm)
 14.88           56

 RUQ(cm)       RLQ(cm)       LUQ(cm)        LLQ(cm)

Biophysical Evaluation

 Amniotic F.V:   Within normal limits       F. Tone:         Observed
 F. Movement:    Observed                   Score:           [DATE]
 F. Breathing:   Observed
OB History

 Gravidity:    2         Term:   0        Prem:   0        SAB:   1
 TOP:          0       Ectopic:  0        Living: 0
Gestational Age

 LMP:           37w 0d        Date:  09/18/18                 EDD:   06/25/19
 Best:          37w 0d     Det. By:  LMP  (09/18/18)          EDD:   06/25/19
Impression

 Biophysical profile [DATE]
 JHIUJ
 Cell Free DNA with insufficient Fetal fraction- Ms. Schmid
 declined further testing
Recommendations

 Continue weekly testing, follow up scheduld on [DATE] and [DATE]

## 2020-05-05 ENCOUNTER — Ambulatory Visit (HOSPITAL_COMMUNITY)
Admission: EM | Admit: 2020-05-05 | Discharge: 2020-05-05 | Disposition: A | Discharge status: Home / Self Care | Payer: Medicaid Other | Attending: Family Medicine | Admitting: Family Medicine

## 2020-05-05 ENCOUNTER — Other Ambulatory Visit: Payer: Self-pay

## 2020-05-05 ENCOUNTER — Encounter (HOSPITAL_COMMUNITY): Payer: Self-pay

## 2020-05-05 DIAGNOSIS — N912 Amenorrhea, unspecified: Secondary | ICD-10-CM | POA: Diagnosis not present

## 2020-05-05 DIAGNOSIS — Z3201 Encounter for pregnancy test, result positive: Secondary | ICD-10-CM | POA: Diagnosis not present

## 2020-05-05 LAB — POC URINE PREG, ED: Preg Test, Ur: POSITIVE — AB

## 2020-05-05 NOTE — ED Triage Notes (Signed)
Pt is here wanting a pregnancy test after her last period was 03/14/2020.

## 2020-05-05 NOTE — ED Provider Notes (Signed)
[facility]   MRN: [id number] DOB: 1995-11-11  Subjective:   Crystal May is a 24 y.o. female presenting for urine pregnancy test, amenorrhea.  Last menstrual cycle was 03/14/2020.  This would not be her first pregnancy.  States that it is an unplanned pregnancy however.  Denies fever, nausea, vomiting, belly or pelvic pain.  No current facility-administered medications for this encounter.  Current Outpatient Medications:  .  ibuprofen (ADVIL) 600 MG tablet, Take 1 tablet (600 mg total) by mouth every 6 (six) hours as needed. (Patient not taking: Reported on 07/31/2019), Disp: 30 tablet, Rfl: 0 .  norgestimate-ethinyl estradiol (ORTHO-CYCLEN) 0.25-35 MG-MCG tablet, Take 1 tablet by mouth daily., Disp: 1 Package, Rfl: 11   No Known Allergies  Past Medical History:  Diagnosis Date  . Anemia in pregnancy 04/30/2019     CBC Latest Ref Rng & Units 04/01/2019 12/31/2018 05/08/2018 WBC 3.4 - 10.8 x10E3/uL 8.5 8.8 6.6 Hemoglobin 11.1 - 15.9 g/dL 10.7(L) 12.0 11.5 Hematocrit 34.0 - 46.6 % 33.6(L) 36.8 34.8 Platelets 150 - 450 x10E3/uL 214 246 276    . Asthma   . Gestational diabetes   . Medical history non-contributory   . Seasonal allergies      Past Surgical History:  Procedure Laterality Date  . KNEE SURGERY      Family History  Problem Relation Age of Onset  . Healthy Mother   . Healthy Father     Social History   Tobacco Use  . Smoking status: Passive Smoke Exposure - Never Smoker  . Smokeless tobacco: Never Used  Vaping Use  . Vaping Use: Never used  Substance Use Topics  . Alcohol use: No  . Drug use: No    ROS   Objective:   Vitals: BP 120/72 (BP Location: Left Arm)   Pulse 79   Temp 98.3 F (36.8 C) (Oral)   Resp 18   LMP 03/14/2020   SpO2 100%   Breastfeeding No   Physical Exam Constitutional:      General: She is not in acute distress.    Appearance: Normal appearance. She is well-developed. She is not ill-appearing.  HENT:     Head:  Normocephalic and atraumatic.     Nose: Nose normal.     Mouth/Throat:     Mouth: Mucous membranes are moist.     Pharynx: Oropharynx is clear.  Eyes:     General: No scleral icterus.    Extraocular Movements: Extraocular movements intact.     Pupils: Pupils are equal, round, and reactive to light.  Cardiovascular:     Rate and Rhythm: Normal rate.  Pulmonary:     Effort: Pulmonary effort is normal.  Skin:    General: Skin is warm and dry.  Neurological:     General: No focal deficit present.     Mental Status: She is alert and oriented to person, place, and time.  Psychiatric:        Mood and Affect: Mood normal.        Behavior: Behavior normal.     Results for orders placed or performed during the hospital encounter of 05/05/20 (from the past 24 hour(s))  POC urine pregnancy     Status: Abnormal   Collection Time: 05/05/20 10:50 AM  Result Value Ref Range   Preg Test, Ur POSITIVE (A) NEGATIVE    Assessment and Plan :   PDMP not reviewed this encounter.  1. Amenorrhea   2. Positive pregnancy test  Positive pregnancy test, general counseling provided including avoidance of alcohol and cigarettes, caffeine.  Counseled on medications generally safe in pregnancy.  Patient verbalized understanding.  She will contact [facility] for pregnancy care. Counseled patient on potential for adverse effects with medications prescribed/recommended today, ER and return-to-clinic precautions discussed, patient verbalized understanding.    Wallis Bamberg, PA-C 05/05/20 1101

## 2020-05-24 ENCOUNTER — Other Ambulatory Visit: Payer: Self-pay

## 2020-05-24 ENCOUNTER — Inpatient Hospital Stay (HOSPITAL_COMMUNITY): Payer: Medicaid Other

## 2020-05-24 ENCOUNTER — Inpatient Hospital Stay (HOSPITAL_COMMUNITY)
Admission: AD | Admit: 2020-05-24 | Discharge: 2020-05-24 | Disposition: A | Discharge status: Home / Self Care | Payer: Medicaid Other | Attending: Obstetrics & Gynecology | Admitting: Obstetrics & Gynecology

## 2020-05-24 ENCOUNTER — Encounter (HOSPITAL_COMMUNITY): Payer: Self-pay | Admitting: Obstetrics & Gynecology

## 2020-05-24 DIAGNOSIS — O200 Threatened abortion: Secondary | ICD-10-CM | POA: Insufficient documentation

## 2020-05-24 DIAGNOSIS — O208 Other hemorrhage in early pregnancy: Secondary | ICD-10-CM | POA: Diagnosis not present

## 2020-05-24 DIAGNOSIS — J45909 Unspecified asthma, uncomplicated: Secondary | ICD-10-CM | POA: Insufficient documentation

## 2020-05-24 DIAGNOSIS — O99511 Diseases of the respiratory system complicating pregnancy, first trimester: Secondary | ICD-10-CM | POA: Diagnosis not present

## 2020-05-24 DIAGNOSIS — Z3A10 10 weeks gestation of pregnancy: Secondary | ICD-10-CM | POA: Insufficient documentation

## 2020-05-24 DIAGNOSIS — O4690 Antepartum hemorrhage, unspecified, unspecified trimester: Secondary | ICD-10-CM

## 2020-05-24 DIAGNOSIS — O418X10 Other specified disorders of amniotic fluid and membranes, first trimester, not applicable or unspecified: Secondary | ICD-10-CM

## 2020-05-24 LAB — COMPREHENSIVE METABOLIC PANEL
ALT: 21 U/L (ref 0–44)
AST: 18 U/L (ref 15–41)
Albumin: 3.6 g/dL (ref 3.5–5.0)
Alkaline Phosphatase: 50 U/L (ref 38–126)
Anion gap: 7 (ref 5–15)
BUN: 9 mg/dL (ref 6–20)
CO2: 23 mmol/L (ref 22–32)
Calcium: 9.2 mg/dL (ref 8.9–10.3)
Chloride: 104 mmol/L (ref 98–111)
Creatinine, Ser: 0.57 mg/dL (ref 0.44–1.00)
GFR calc Af Amer: >60 mL/min (ref >60)
GFR calc non Af Amer: >60 mL/min (ref >60)
Glucose, Bld: 87 mg/dL (ref 70–99)
Potassium: 4.2 mmol/L (ref 3.5–5.1)
Sodium: 134 mmol/L — ABNORMAL LOW (ref 135–145)
Total Bilirubin: 0.5 mg/dL (ref 0.3–1.2)
Total Protein: 6.7 g/dL (ref 6.5–8.1)

## 2020-05-24 LAB — CBC WITH DIFFERENTIAL/PLATELET
Abs Immature Granulocytes: 0.01 10*3/uL (ref 0.00–0.07)
Basophils Absolute: 0 10*3/uL (ref 0.0–0.1)
Basophils Relative: 0 %
Eosinophils Absolute: 0.2 10*3/uL (ref 0.0–0.5)
Eosinophils Relative: 3 %
HCT: 38.2 % (ref 36.0–46.0)
Hemoglobin: 11.9 g/dL — ABNORMAL LOW (ref 12.0–15.0)
Immature Granulocytes: 0 %
Lymphocytes Relative: 38 %
Lymphs Abs: 2.2 10*3/uL (ref 0.7–4.0)
MCH: 25.1 pg — ABNORMAL LOW (ref 26.0–34.0)
MCHC: 31.2 g/dL (ref 30.0–36.0)
MCV: 80.4 fL (ref 80.0–100.0)
Monocytes Absolute: 0.3 10*3/uL (ref 0.1–1.0)
Monocytes Relative: 5 %
Neutro Abs: 3.2 10*3/uL (ref 1.7–7.7)
Neutrophils Relative %: 54 %
Platelets: 220 10*3/uL (ref 150–400)
RBC: 4.75 MIL/uL (ref 3.87–5.11)
RDW: 13.5 % (ref 11.5–15.5)
WBC: 5.9 10*3/uL (ref 4.0–10.5)
nRBC: 0 % (ref 0.0–0.2)

## 2020-05-24 LAB — URINALYSIS, ROUTINE W REFLEX MICROSCOPIC
Bilirubin Urine: NEGATIVE
Glucose, UA: NEGATIVE mg/dL
Hgb urine dipstick: NEGATIVE
Ketones, ur: NEGATIVE mg/dL
Leukocytes,Ua: NEGATIVE
Nitrite: NEGATIVE
Protein, ur: NEGATIVE mg/dL
Specific Gravity, Urine: 1.023 (ref 1.005–1.030)
pH: 6 (ref 5.0–8.0)

## 2020-05-24 LAB — HCG, QUANTITATIVE, PREGNANCY: hCG, Beta Chain, Quant, S: 22972 m[IU]/mL — ABNORMAL HIGH (ref <5)

## 2020-05-24 MED ORDER — OXYCODONE-ACETAMINOPHEN 5-325 MG PO TABS: 1.0000 | ORAL_TABLET | ORAL | 0 refills | Status: DC | PRN

## 2020-05-24 MED ORDER — IBUPROFEN 600 MG PO TABS: 600.0000 mg | ORAL_TABLET | Freq: Four times a day (QID) | ORAL | 0 refills | Status: DC | PRN

## 2020-05-24 MED ORDER — RHO D IMMUNE GLOBULIN 1500 UNIT/2ML IJ SOSY
300.0000 ug | PREFILLED_SYRINGE | Freq: Once | INTRAMUSCULAR | Status: AC
  Administered 2020-05-24: 300 ug via INTRAMUSCULAR
  Filled 2020-05-24: qty 2

## 2020-05-24 NOTE — ED Triage Notes (Signed)
Pt arrives to ED w/ c/o mild vaginal bleeding. Pt endorses 3/10 abdominal pain. Pt states she is 10 weeks preg.

## 2020-05-24 NOTE — Discharge Instructions (Signed)
Threatened Miscarriage  A threatened miscarriage occurs when a woman has vaginal bleeding during the first 20 weeks of pregnancy but the pregnancy has not ended. If you have vaginal bleeding during this time, your health care provider will do tests to make sure you are still pregnant. If the tests show that you are still pregnant and that the developing baby (fetus) inside your uterus is still growing, your condition is considered a threatened miscarriage. A threatened miscarriage does not mean your pregnancy will end, but it does increase the risk of losing your pregnancy (complete miscarriage). What are the causes? The cause of this condition is usually not known. For women who go on to have a complete miscarriage, the most common cause is an abnormal number of chromosomes in the developing baby. Chromosomes are the structures inside cells that hold all of a person's genetic material. What increases the risk? The following lifestyle factors may increase your risk of a miscarriage in early pregnancy:  Smoking.  Drinking excessive amounts of alcohol or caffeine.  Recreational drug use. The following preexisting health conditions may increase your risk of a miscarriage in early pregnancy:  Polycystic ovary syndrome.  Uterine fibroids.  Infections.  Diabetes mellitus. What are the signs or symptoms? Symptoms of this condition include:  Vaginal bleeding.  Mild abdominal pain or cramps. How is this diagnosed? If you have bleeding with or without abdominal pain before 20 weeks of pregnancy, your health care provider will do tests to check whether you are still pregnant. These will include:  Ultrasound. This test uses sound waves to create images of the inside of your uterus. This allows your health care provider to look at your developing baby and other structures, such as your placenta.  Pelvic exam. This is an internal exam of your vagina and cervix.  Measurement of your baby's heart  rate.  Laboratory tests such as blood tests, urine tests, or swabs for infection You may be diagnosed with a threatened miscarriage if:  Ultrasound testing shows that you are still pregnant.  Your baby's heart rate is strong.  A pelvic exam shows that the opening between your uterus and your vagina (cervix) is closed.  Blood tests confirm that you are still pregnant. How is this treated? No treatments have been shown to prevent a threatened miscarriage from going on to a complete miscarriage. However, the right home care is important. Follow these instructions at home:  Get plenty of rest.  Do not have sex or use tampons if you have vaginal bleeding.  Do not douche.  Do not smoke or use recreational drugs.  Do not drink alcohol.  Avoid caffeine.  Keep all follow-up prenatal visits as told by your health care provider. This is important. Contact a health care provider if:  You have light vaginal bleeding or spotting while pregnant.  You have abdominal pain or cramping.  You have a fever. Get help right away if:  You have heavy vaginal bleeding.  You have blood clots coming from your vagina.  You pass tissue from your vagina.  You leak fluid, or you have a gush of fluid from your vagina.  You have severe low back pain or abdominal cramps.  You have fever, chills, and severe abdominal pain. Summary  A threatened miscarriage occurs when a woman has vaginal bleeding during the first 20 weeks of pregnancy but the pregnancy has not ended.  The cause of a threatened miscarriage is usually not known.  Symptoms of this condition may   include vaginal bleeding and mild abdominal pain or cramps.  No treatments have been shown to prevent a threatened miscarriage from going on to a complete miscarriage.  Keep all follow-up prenatal visits as told by your health care provider. This is important. This information is not intended to replace advice given to you by your health  care provider. Make sure you discuss any questions you have with your health care provider. Document Revised: 10/04/2017 Document Reviewed: 11/24/2016 Elsevier Patient Education  2020 Elsevier Inc.  

## 2020-05-24 NOTE — MAU Provider Note (Signed)
History     CSN: [id number]  Arrival date and time: 05/24/20 1327   First Provider Initiated Contact with Patient 05/24/20 1616      Chief Complaint  Patient presents with  . Vaginal Bleeding   HPI   Ms.Crystal May is a 24 y.o. female G3P1011 @ 5971w1d here in MAU with complaints of vaginal bleeding. The bleeding started at 8 am and it was very light. She has not had any bleeding since then. She has some lower back and lower abdomianl pain. She rates her pain 3/10. The pain comes and goes.  She is concerned she is having a miscarriage.   OB History    Gravida  3   Para  1   Term  1   Preterm      AB  1   Living  1     SAB  1   TAB      Ectopic      Multiple  0   Live Births  1           Past Medical History:  Diagnosis Date  . Anemia in pregnancy 04/30/2019     CBC Latest Ref Rng & Units 04/01/2019 12/31/2018 05/08/2018 WBC 3.4 - 10.8 x10E3/uL 8.5 8.8 6.6 Hemoglobin 11.1 - 15.9 g/dL 10.7(L) 12.0 11.5 Hematocrit 34.0 - 46.6 % 33.6(L) 36.8 34.8 Platelets 150 - 450 x10E3/uL 214 246 276    . Asthma   . Gestational diabetes   . Medical history non-contributory   . Seasonal allergies     Past Surgical History:  Procedure Laterality Date  . KNEE SURGERY      Family History  Problem Relation Age of Onset  . Healthy Mother   . Healthy Father     Social History   Tobacco Use  . Smoking status: Passive Smoke Exposure - Never Smoker  . Smokeless tobacco: Never Used  Vaping Use  . Vaping Use: Never used  Substance Use Topics  . Alcohol use: No  . Drug use: No    Allergies: No Known Allergies  Medications Prior to Admission  Medication Sig Dispense Refill Last Dose  . ibuprofen (ADVIL) 600 MG tablet Take 1 tablet (600 mg total) by mouth every 6 (six) hours as needed. (Patient not taking: Reported on 07/31/2019) 30 tablet 0   . norgestimate-ethinyl estradiol (ORTHO-CYCLEN) 0.25-35 MG-MCG tablet Take 1 tablet by mouth daily. 1 Package 11    Results  for orders placed or performed during the hospital encounter of 05/24/20 (from the past 48 hour(s))  Urinalysis, Routine w reflex microscopic Urine, Clean Catch     Status: None   Collection Time: 05/24/20  3:29 PM  Result Value Ref Range   Color, Urine YELLOW YELLOW   APPearance CLEAR CLEAR   Specific Gravity, Urine 1.023 1.005 - 1.030   pH 6.0 5.0 - 8.0   Glucose, UA NEGATIVE NEGATIVE mg/dL   Hgb urine dipstick NEGATIVE NEGATIVE   Bilirubin Urine NEGATIVE NEGATIVE   Ketones, ur NEGATIVE NEGATIVE mg/dL   Protein, ur NEGATIVE NEGATIVE mg/dL   Nitrite NEGATIVE NEGATIVE   Leukocytes,Ua NEGATIVE NEGATIVE    Comment: Performed at [facility], [address] [place], [place] [address]  CBC with Differential/Platelet     Status: Abnormal   Collection Time: 05/24/20  4:54 PM  Result Value Ref Range   WBC 5.9 4.0 - 10.5 K/uL   RBC 4.75 3.87 - 5.11 MIL/uL   Hemoglobin 11.9 (L) 12.0 - 15.0  g/dL   HCT 81.1 36 - 46 %   MCV 80.4 80.0 - 100.0 fL   MCH 25.1 (L) 26.0 - 34.0 pg   MCHC 31.2 30.0 - 36.0 g/dL   RDW 91.4 78.2 - 95.6 %   Platelets 220 150 - 400 K/uL   nRBC 0.0 0.0 - 0.2 %   Neutrophils Relative % 54 %   Neutro Abs 3.2 1.7 - 7.7 K/uL   Lymphocytes Relative 38 %   Lymphs Abs 2.2 0.7 - 4.0 K/uL   Monocytes Relative 5 %   Monocytes Absolute 0.3 0 - 1 K/uL   Eosinophils Relative 3 %   Eosinophils Absolute 0.2 0 - 0 K/uL   Basophils Relative 0 %   Basophils Absolute 0.0 0 - 0 K/uL   Immature Granulocytes 0 %   Abs Immature Granulocytes 0.01 0.00 - 0.07 K/uL    Comment: Performed at [facility], [address], [place], [place] [address]  Comprehensive metabolic panel     Status: Abnormal   Collection Time: 05/24/20  4:54 PM  Result Value Ref Range   Sodium 134 (L) 135 - 145 mmol/L   Potassium 4.2 3.5 - 5.1 mmol/L   Chloride 104 98 - 111 mmol/L   CO2 23 22 - 32 mmol/L   Glucose, Bld 87 70 - 99 mg/dL    Comment: Glucose reference range applies only to samples taken  after fasting for at least 8 hours.   BUN 9 6 - 20 mg/dL   Creatinine, Ser 6.57 0.44 - 1.00 mg/dL   Calcium 9.2 8.9 - 84.6 mg/dL   Total Protein 6.7 6.5 - 8.1 g/dL   Albumin 3.6 3.5 - 5.0 g/dL   AST 18 15 - 41 U/L   ALT 21 0 - 44 U/L   Alkaline Phosphatase 50 38 - 126 U/L   Total Bilirubin 0.5 0.3 - 1.2 mg/dL   GFR calc non Af Amer >60 >60 mL/min   GFR calc Af Amer >60 >60 mL/min   Anion gap 7 5 - 15    Comment: Performed at [facility], [address], [place], [place] [address]  hCG, quantitative, pregnancy     Status: Abnormal   Collection Time: 05/24/20  4:54 PM  Result Value Ref Range   hCG, Beta Chain, Quant, S 22,972 (H) <5 mIU/mL    Comment:          GEST. AGE      CONC.  (mIU/mL)   <=1 WEEK        5 - 50     2 WEEKS       50 - 500     3 WEEKS       100 - 10,000     4 WEEKS     1,000 - 30,000     5 WEEKS     3,500 - 115,000   6-8 WEEKS     12,000 - 270,000    12 WEEKS     15,000 - 220,000        FEMALE AND NON-PREGNANT FEMALE:     LESS THAN 5 mIU/mL Performed at [facility], [address], [place], [place] [address]   Rh IG workup (includes ABO/Rh)     Status: None (Preliminary result)   Collection Time: 05/24/20  4:54 PM  Result Value Ref Range   Gestational Age(Wks) 10    ABO/RH(D) O NEG    Antibody Screen NEG  Unit Number [id number]    Blood Component Type RHIG    Unit division 00    Status of Unit ISSUED    Transfusion Status      OK TO TRANSFUSE Performed at [facility], [address], [place], [place] [address]    US OB LESS THAN 14 WEEKS WITH [place] TRANSVAGINAL  Result Date: 05/24/2020 CLINICAL DATA:  Bleeding, cramps this a.m., quantitative beta HCG 22,972. Last menstrual period 03/14/2020 with gestational age by last menstrual period of 10 weeks and 1 day, estimated due date of 12/19/2020 EXAM: OBSTETRIC <14 WK [place] AND TRANSVAGINAL OB US DOPPLER ULTRASOUND OF OVARIES TECHNIQUE: Both transabdominal and transvaginal ultrasound  examinations were performed for complete evaluation of the gestation as well as the maternal uterus, adnexal regions, and pelvic cul-de-sac. Transvaginal technique was performed to assess early pregnancy. Color and duplex Doppler ultrasound was utilized to evaluate blood flow to the ovaries. COMPARISON:  None FINDINGS: Intrauterine gestational sac: Single and slightly irregular shaped. Yolk sac:  Small yolk sac visualized (image 89 and 90 of 121). Embryo:  Not Visualized. Cardiac Activity: Not Visualized. MSD: 16.2 mm   6 w   3 d Subchorionic hemorrhage: Small visualized (cine number 2 image 48 of 161). Maternal uterus/adnexae: The right ovary measures 2.1 x 1.9 x 2.3 cm. The left ovary measures 2.1 x 2 x 1.7 cm. Normal right ovary with a corpus luteum cyst. Normal left ovary. Other: Trace simple free pelvic fluid. Pulsed Doppler evaluation of both ovaries demonstrates normal appearing low-resistance arterial and venous waveforms. IMPRESSION: 1. Intrauterine pregnancy of uncertain viability with a single intrauterine gestational sac measuring up to 16.2 mm, small yolk sac, but no embryo visualized. Considering mean gestational sac diameter of 16.2 mm and no embryo visualized, the pregnancy is suspicious for non-viability. Findings are suspicious but not yet definitive for failed pregnancy. Recommend follow-up US in 10-14 days for definitive diagnosis. This recommendation follows SRU consensus guidelines: Diagnostic Criteria for Nonviable Pregnancy Early in the First Trimester. Malva Limes Med 2013; 096:2836-62. 2. Small subchorionic hemorrhage. Electronically Signed   By: Tish Frederickson M.D.   On: 05/24/2020 19:01   Review of Systems  Constitutional: Negative for fever.  Gastrointestinal: Positive for abdominal pain.  Genitourinary: Positive for vaginal bleeding.   Physical Exam   Blood pressure 125/70, pulse 85, temperature 98.7 F (37.1 C), temperature source Oral, resp. rate 17, height 5\' 2"  (1.575 m),  weight 83.8 kg, last menstrual period 03/14/2020, SpO2 100 %, not currently breastfeeding.  Physical Exam Constitutional:      Appearance: Normal appearance.  HENT:     Head: Normocephalic.  Eyes:     Pupils: Pupils are equal, round, and reactive to light.  Abdominal:     Palpations: Abdomen is soft.     Tenderness: There is no abdominal tenderness.  Musculoskeletal:        General: Normal range of motion.  Neurological:     General: No focal deficit present.     Mental Status: She is alert.  Psychiatric:        Mood and Affect: Mood normal.    MAU Course  Procedures  None  MDM  Patient unable to be brought back to an MAU room d/t wait time. Patient was spoken to in triage; limited exam done. She denies bleeding at this time.  05/15/2020 was reviewed in detail with the patient. All questions answered Rhogam given in MAU.   Assessment and Plan   A:  1. Threatened miscarriage   2. Vaginal bleeding during pregnancy   3. Subchorionic hemorrhage of placenta in first trimester, single or unspecified fetus     P:  Discharge home in stable condition Repeat US in 7 days for viability Reviewed [place] in detail with patient Rx: Percocet, ibuprofen Return to MAU if symptoms worsen   Conall Vangorder, Harolyn Rutherford, NP 05/24/2020 7:47 PM

## 2020-05-24 NOTE — MAU Note (Signed)
Was bleeding a little bit this morning. Having a little cramping, just wanted to make sure everything was ok.

## 2020-05-24 NOTE — ED Triage Notes (Signed)
Emergency Medicine Provider OB Triage Evaluation Note  Crystal May is a 24 y.o. female, G2P1011, at Unknown gestation who presents to the emergency department with complaints of vaginal bleeding and abdominal cramping noted upon using the bathroom this morning. States discharge is mild, pink in color. Not saturating pads. No nausea, vomiting, or fevers.   Review of  Systems  Positive: vaginal bleeding, abdominal cramping Negative: nausea, vomiting, fevers  Physical Exam  BP 116/80   Pulse 87   Temp 98.9 F (37.2 C) (Oral)   Resp 16   SpO2 100%  General: Awake, no distress  HEENT: Atraumatic  Resp: Normal effort  Cardiac: Normal rate Abd: Nondistended, nontender  MSK: Moves all extremities without difficulty Neuro: Speech clear  Medical Decision Making  Pt evaluated for pregnancy concern and is stable for transfer to MAU. Pt is in agreement with plan for transfer.  1:56 PM Discussed with MAU APP, Victorino Dike, who accepts patient in transfer.  Clinical Impression  Differential broad, consider threatened abortion. Abdomen soft, nontender, doubt surgical abdominal pathology.     Jeanie Sewer, PA-C 05/24/20 1401

## 2020-05-25 LAB — RH IG WORKUP (INCLUDES ABO/RH)
ABO/RH(D): O NEG
Antibody Screen: NEGATIVE
Gestational Age(Wks): 10
Unit division: 0

## 2020-05-28 ENCOUNTER — Inpatient Hospital Stay (HOSPITAL_COMMUNITY): Payer: Medicaid Other

## 2020-05-28 ENCOUNTER — Encounter (HOSPITAL_COMMUNITY): Payer: Self-pay | Admitting: Obstetrics & Gynecology

## 2020-05-28 ENCOUNTER — Other Ambulatory Visit: Payer: Self-pay

## 2020-05-28 ENCOUNTER — Inpatient Hospital Stay (HOSPITAL_COMMUNITY)
Admission: EM | Admit: 2020-05-28 | Discharge: 2020-05-28 | Disposition: A | Discharge status: Home / Self Care | Payer: Medicaid Other | Attending: Emergency Medicine | Admitting: Emergency Medicine

## 2020-05-28 DIAGNOSIS — N939 Abnormal uterine and vaginal bleeding, unspecified: Secondary | ICD-10-CM

## 2020-05-28 DIAGNOSIS — J45909 Unspecified asthma, uncomplicated: Secondary | ICD-10-CM | POA: Diagnosis not present

## 2020-05-28 DIAGNOSIS — O209 Hemorrhage in early pregnancy, unspecified: Secondary | ICD-10-CM | POA: Insufficient documentation

## 2020-05-28 DIAGNOSIS — Z3A10 10 weeks gestation of pregnancy: Secondary | ICD-10-CM | POA: Insufficient documentation

## 2020-05-28 DIAGNOSIS — O039 Complete or unspecified spontaneous abortion without complication: Secondary | ICD-10-CM

## 2020-05-28 DIAGNOSIS — N938 Other specified abnormal uterine and vaginal bleeding: Secondary | ICD-10-CM | POA: Diagnosis not present

## 2020-05-28 DIAGNOSIS — O24419 Gestational diabetes mellitus in pregnancy, unspecified control: Secondary | ICD-10-CM | POA: Insufficient documentation

## 2020-05-28 LAB — URINALYSIS, ROUTINE W REFLEX MICROSCOPIC
Bacteria, UA: NONE SEEN
Bilirubin Urine: NEGATIVE
Glucose, UA: NEGATIVE mg/dL
Ketones, ur: NEGATIVE mg/dL
Leukocytes,Ua: NEGATIVE
Nitrite: NEGATIVE
Protein, ur: 30 mg/dL — AB
Specific Gravity, Urine: 1.031 — ABNORMAL HIGH (ref 1.005–1.030)
pH: 5 (ref 5.0–8.0)

## 2020-05-28 LAB — CBC WITH DIFFERENTIAL/PLATELET
Abs Immature Granulocytes: 0.01 10*3/uL (ref 0.00–0.07)
Basophils Absolute: 0 10*3/uL (ref 0.0–0.1)
Basophils Relative: 0 %
Eosinophils Absolute: 0.2 10*3/uL (ref 0.0–0.5)
Eosinophils Relative: 4 %
HCT: 37.7 % (ref 36.0–46.0)
Hemoglobin: 12 g/dL (ref 12.0–15.0)
Immature Granulocytes: 0 %
Lymphocytes Relative: 44 %
Lymphs Abs: 2.7 10*3/uL (ref 0.7–4.0)
MCH: 26.1 pg (ref 26.0–34.0)
MCHC: 31.8 g/dL (ref 30.0–36.0)
MCV: 82 fL (ref 80.0–100.0)
Monocytes Absolute: 0.4 10*3/uL (ref 0.1–1.0)
Monocytes Relative: 6 %
Neutro Abs: 2.8 10*3/uL (ref 1.7–7.7)
Neutrophils Relative %: 46 %
Platelets: 218 10*3/uL (ref 150–400)
RBC: 4.6 MIL/uL (ref 3.87–5.11)
RDW: 13.7 % (ref 11.5–15.5)
WBC: 6.1 10*3/uL (ref 4.0–10.5)
nRBC: 0 % (ref 0.0–0.2)

## 2020-05-28 LAB — HCG, QUANTITATIVE, PREGNANCY: hCG, Beta Chain, Quant, S: 8825 m[IU]/mL — ABNORMAL HIGH (ref <5)

## 2020-05-28 MED ORDER — PREPLUS 27-1 MG PO TABS: 1.0000 | ORAL_TABLET | Freq: Every day | ORAL | 13 refills | Status: DC

## 2020-05-28 NOTE — MAU Provider Note (Signed)
History     CSN: [id number]  Arrival date and time: 05/28/20 4098   First Provider Initiated Contact with Patient 05/28/20 0901      Chief Complaint  Patient presents with   Abdominal Pain   Vaginal Bleeding   Crystal May is a 24 y.o. G3P1011 at [redacted]w[redacted]d who receives care at [facility].  She presents today for Abdominal Pain and Vaginal Bleeding.  She states her bleeding was heavy after her visit on Monday 9/13 evening and has been of varying flows since.  She states she passed a large clot that causes her cramping to improve.  She reports no passing of clots and bleeding that is like a menses.  She reports some mild cramping.  Patient denies recent sexual activity.     OB History    Gravida  3   Para  1   Term  1   Preterm      AB  1   Living  1     SAB  1   TAB      Ectopic      Multiple  0   Live Births  1           Past Medical History:  Diagnosis Date   Anemia in pregnancy 04/30/2019     CBC Latest Ref Rng & Units 04/01/2019 12/31/2018 05/08/2018 WBC 3.4 - 10.8 x10E3/uL 8.5 8.8 6.6 Hemoglobin 11.1 - 15.9 g/dL 10.7(L) 12.0 11.5 Hematocrit 34.0 - 46.6 % 33.6(L) 36.8 34.8 Platelets 150 - 450 x10E3/uL 214 246 276     Asthma    Gestational diabetes    Medical history non-contributory    Seasonal allergies     Past Surgical History:  Procedure Laterality Date   KNEE SURGERY      Family History  Problem Relation Age of Onset   Healthy Mother    Healthy Father     Social History   Tobacco Use   Smoking status: Passive Smoke Exposure - Never Smoker   Smokeless tobacco: Never Used  Vaping Use   Vaping Use: Never used  Substance Use Topics   Alcohol use: No   Drug use: No    Allergies: No Known Allergies  Medications Prior to Admission  Medication Sig Dispense Refill Last Dose   ibuprofen (ADVIL) 600 MG tablet Take 1 tablet (600 mg total) by mouth every 6 (six) hours as needed. 30 tablet 0    norgestimate-ethinyl estradiol  (ORTHO-CYCLEN) 0.25-35 MG-MCG tablet Take 1 tablet by mouth daily. 1 Package 11    oxyCODONE-acetaminophen (PERCOCET) 5-325 MG tablet Take 1-2 tablets by mouth every 4 (four) hours as needed for severe pain. 5 tablet 0     Review of Systems  Constitutional: Negative for chills and fever.  Respiratory: Negative for cough and shortness of breath.   Gastrointestinal: Positive for abdominal pain (Cramping). Negative for constipation, diarrhea, nausea and vomiting.  Genitourinary: Positive for vaginal bleeding. Negative for difficulty urinating, dysuria, pelvic pain and vaginal discharge.  Neurological: Negative for dizziness, light-headedness and headaches.   Physical Exam   Blood pressure 126/77, pulse 71, temperature 98.9 F (37.2 C), temperature source Oral, resp. rate 16, last menstrual period 03/14/2020, SpO2 100 %, not currently breastfeeding.  Physical Exam Vitals and nursing note reviewed. Exam conducted with a chaperone present.  Constitutional:      General: She is not in acute distress.    Appearance: Normal appearance. She is well-developed.  HENT:     Head: Normocephalic and atraumatic.  Eyes:     Conjunctiva/sclera: Conjunctivae normal.  Cardiovascular:     Rate and Rhythm: Normal rate and regular rhythm.     Heart sounds: Normal heart sounds.  Pulmonary:     Effort: Pulmonary effort is normal. No respiratory distress.     Breath sounds: Normal breath sounds.  Abdominal:     General: Bowel sounds are normal.     Palpations: Abdomen is soft.     Tenderness: There is no abdominal tenderness.  Genitourinary:    Comments: Deferred Musculoskeletal:        General: Normal range of motion.     Cervical back: Normal range of motion.  Skin:    General: Skin is warm and dry.  Neurological:     Mental Status: She is alert and oriented to person, place, and time.  Psychiatric:        Mood and Affect: Mood normal.        Behavior: Behavior normal.        Thought Content:  Thought content normal.     MAU Course  Procedures Results for orders placed or performed during the hospital encounter of 05/28/20 (from the past 24 hour(s))  Urinalysis, Routine w reflex microscopic Urine, Clean Catch     Status: Abnormal   Collection Time: 05/28/20  8:29 AM  Result Value Ref Range   Color, Urine YELLOW YELLOW   APPearance CLEAR CLEAR   Specific Gravity, Urine 1.031 (H) 1.005 - 1.030   pH 5.0 5.0 - 8.0   Glucose, UA NEGATIVE NEGATIVE mg/dL   Hgb urine dipstick LARGE (A) NEGATIVE   Bilirubin Urine NEGATIVE NEGATIVE   Ketones, ur NEGATIVE NEGATIVE mg/dL   Protein, ur 30 (A) NEGATIVE mg/dL   Nitrite NEGATIVE NEGATIVE   Leukocytes,Ua NEGATIVE NEGATIVE   RBC / HPF 11-20 0 - 5 RBC/hpf   WBC, UA 0-5 0 - 5 WBC/hpf   Bacteria, UA NONE SEEN NONE SEEN   Squamous Epithelial / LPF 0-5 0 - 5   Mucus PRESENT   CBC with Differential/Platelet     Status: None   Collection Time: 05/28/20  8:45 AM  Result Value Ref Range   WBC 6.1 4.0 - 10.5 K/uL   RBC 4.60 3.87 - 5.11 MIL/uL   Hemoglobin 12.0 12.0 - 15.0 g/dL   HCT 61.4 36 - 46 %   MCV 82.0 80.0 - 100.0 fL   MCH 26.1 26.0 - 34.0 pg   MCHC 31.8 30.0 - 36.0 g/dL   RDW 43.1 54.0 - 08.6 %   Platelets 218 150 - 400 K/uL   nRBC 0.0 0.0 - 0.2 %   Neutrophils Relative % 46 %   Neutro Abs 2.8 1.7 - 7.7 K/uL   Lymphocytes Relative 44 %   Lymphs Abs 2.7 0.7 - 4.0 K/uL   Monocytes Relative 6 %   Monocytes Absolute 0.4 0 - 1 K/uL   Eosinophils Relative 4 %   Eosinophils Absolute 0.2 0 - 0 K/uL   Basophils Relative 0 %   Basophils Absolute 0.0 0 - 0 K/uL   Immature Granulocytes 0 %   Abs Immature Granulocytes 0.01 0.00 - 0.07 K/uL  hCG, quantitative, pregnancy     Status: Abnormal   Collection Time: 05/28/20  8:45 AM  Result Value Ref Range   hCG, Beta Chain, Quant, S 8,825 (H) <5 mIU/mL   US OB Transvaginal  Result Date: 05/28/2020 CLINICAL DATA:  Bleeding.  Positive beta HCG. EXAM: OBSTETRIC <14 WK [place] AND  TRANSVAGINAL  OB US TECHNIQUE: Both transabdominal and transvaginal ultrasound examinations were performed for complete evaluation of the gestation as well as the maternal uterus, adnexal regions, and pelvic cul-de-sac. Transvaginal technique was performed to assess early pregnancy. COMPARISON:  05/24/2020 FINDINGS: There is no longer any visible intrauterine gestational sac or fluid. Uterus appears normal. Endometrium is thickened measuring up to 2 cm but otherwise unremarkable. Both ovaries are normal with the right measuring 3.6 x 1.9 x 2.0 cm and the left measuring 3.1 x 1.7 x 1.3 cm. No free fluid. No evidence of pelvic mass. IMPRESSION: Findings consistent with completed abortion. Abnormal gestational sac seen 4 days ago is no longer present. Mild endometrial thickening. Normal appearing adnexa. Electronically Signed   By: Paulina Fusi M.D.   On: 05/28/2020 09:52    MDM PE Labs: CBC, hCG Ultrasound Assessment and Plan  24 year old G3P1011 at 10.5 weeks Vaginal Bleeding  -Labs ordered and drawn prior to provider at bedside. -POC discussed. -Pelvic exam deferred as cultures obtained 4 days ago. -Patient offered and declines pain medication. -Will send to [place] and await results.  Cherre Robins 05/28/2020, 9:01 AM   Reassessment (10:46 AM) Complete AB O Negative  -Provider to bedside to discuss results. -Condolences given. -Reviewed follow up procedure to include lab and office visit.  -Patient s/p Rhogam on 9/13. -Discussed abstaining from sexual activity until bleeding subsides. -Patient indifferent regarding pregnancy. -Discussed and prescription given for PNV. -Encouraged to wait until after next menses before attempting to conceive.  -Patient without questions or concerns. -Bleeding Precautions Given. -Encouraged to call or return to MAU if symptoms worsen or with the onset of new symptoms. -Discharged to home in stable condition.  Cherre Robins MSN, CNM Advanced Practice Provider,  [facility] [employer]

## 2020-05-28 NOTE — MAU Note (Signed)
Crystal May is a 24 y.o. at [redacted]w[redacted]d here in MAU reporting: was here on Monday with spotting and was told to come back if bleeding got heavier. That night bleeding got heavier on and off until yesterday when it got really heavy. States she thinks she had a miscarriage and came back today to make sure she passed everything. Today bleeding is lighter. Having some cramping but states it is not as bad as yesterday.  Onset of complaint: ongoing but worse  Pain score: 3/10  Vitals:   05/28/20 0726 05/28/20 0833  BP: 119/75 126/77  Pulse: 89 71  Resp: 16 16  Temp: 98.9 F (37.2 C) 98.9 F (37.2 C)  SpO2: 100% 100%     Lab orders placed from triage: UA

## 2020-05-28 NOTE — ED Provider Notes (Signed)
MSE was initiated and I personally evaluated the patient and placed orders (if any) at  8:12 AM on May 28, 2020.  The patient appears stable so that the remainder of the MSE may be completed by another provider.  24 year old female G3 P1-0-1-1 at approximately 10 weeks.  Presents to the ER for recurrent vaginal bleeding during this pregnancy.  States yesterday she passed clots and tissue.  She passed a clot that was as big as her palm.  This has slowed down this morning.  Complains of intermittent lower abdominal pain.  Seen at MAU 2 days ago and states she was told to come back if her bleeding returns.  Denies any fever, chest pain, shortness of breath.  Breathing even and unlabored.  Speaking in full sentences.  Blood pressure 119/75.  Patient is appropriate for transfer over to MAU for further evaluation.  Asked EMT to transport.   Liberty Handy, PA-C 05/28/20 6384    Tilden Fossa, MD 05/28/20 1151

## 2020-05-28 NOTE — Discharge Instructions (Signed)
Managing Pregnancy Loss Pregnancy loss can happen any time during a pregnancy. Often the cause is not known. It is rarely because of anything you did. Pregnancy loss in early pregnancy (during the first trimester) is called a miscarriage. This type of pregnancy loss is the most common. Pregnancy loss that happens after 20 weeks of pregnancy is called fetal demise if the baby's heart stops beating before birth. Fetal demise is much less common. Some women experience spontaneous labor shortly after fetal demise resulting in a stillborn birth (stillbirth). Any pregnancy loss can be devastating. You will need to recover both physically and emotionally. Most women are able to get pregnant again after a pregnancy loss and deliver a healthy baby. How to manage emotional recovery  Pregnancy loss is very hard emotionally. You may feel many different emotions while you grieve. You may feel sad and angry. You may also feel guilty. It is normal to have periods of crying. Emotional recovery can take longer than physical recovery. It is different for everyone. Taking these steps can help you in managing this loss:  Remember that it is unlikely you did anything to cause the pregnancy loss.  Share your thoughts and feelings with friends, family, and your partner. Remember that your partner is also recovering emotionally.  Make sure you have a good support system. Do not spend too much time alone.  Meet with a pregnancy loss counselor or join a pregnancy loss support group.  Get enough sleep and eat a healthy diet. Return to regular exercise when you have recovered physically.  Do not use drugs or alcohol to manage your emotions.  Consider seeing a mental health professional to help you recover emotionally.  Ask a friend or loved one to help you decide what to do with any clothing and nursery items you received for your baby. In the case of a stillbirth, many women benefit from taking additional steps in the  grieving process. You may want to:  Hold your baby after the birth.  Name your baby.  Request a birth certificate.  Create a keepsake such as handprints or footprints.  Dress your baby and have a picture taken.  Make funeral arrangements.  Ask for a baptism or blessing. Hospitals have staff members who can help you with all these arrangements. How to recognize emotional stress It is normal to have emotional stress after a pregnancy loss. But emotional stress that lasts a long time or becomes severe requires treatment. Watch out for these signs of severe emotional stress:  Sadness, anger, or guilt that is not going away and is interfering with your normal activities.  Relationship problems that have occurred or gotten worse since the pregnancy loss.  Signs of depression that last longer than 2 weeks. These may include: ? Sadness. ? Anxiety. ? Hopelessness. ? Loss of interest in activities you enjoy. ? Inability to concentrate. ? Trouble sleeping or sleeping too much. ? Loss of appetite or overeating. ? Thoughts of death or of hurting yourself. Follow these instructions at home:  Take over-the-counter and prescription medicines only as told by your health care provider.  Rest at home until your energy level returns. Return to your normal activities as told by your health care provider. Ask your health care provider what activities are safe for you.  When you are ready, meet with your health care provider to discuss steps to take for a future pregnancy.  Keep all follow-up visits as told by your health care provider. This is important.   Where to find support  To help you and your partner with the process of grieving, talk with your health care provider or seek counseling.  Consider meeting with others who have experienced pregnancy loss. Ask your health care provider about support groups and resources. Where to find more information  [place] Department of Health and Human  Services Office on Women's Health: www.womenshealth.gov  American Pregnancy Association: www.americanpregnancy.org Contact a health care provider if:  You continue to experience grief, sadness, or lack of motivation for everyday activities, and those feelings do not improve over time.  You are struggling to recover emotionally, especially if you are using alcohol or substances to help. Get help right away if:  You have thoughts of hurting yourself or others. If you ever feel like you may hurt yourself or others, or have thoughts about taking your own life, get help right away. You can go to your nearest emergency department or call:  Your local emergency services (911 in the [place]  A suicide crisis helpline, such as the National Suicide Prevention Lifeline at 1-800-273-8255. This is open 24 hours a day. Summary  Any pregnancy loss can be difficult physically and emotionally.  You may experience many different emotions while you grieve. Emotional recovery can last longer than physical recovery.  It is normal to have emotional stress after a pregnancy loss. But emotional stress that lasts a long time or becomes severe requires treatment.  See your health care provider if you are struggling emotionally after a pregnancy loss. This information is not intended to replace advice given to you by your health care provider. Make sure you discuss any questions you have with your health care provider. Document Revised: 12/18/2018 Document Reviewed: 11/08/2017 Elsevier Patient Education  2020 Elsevier Inc.  

## 2020-05-31 ENCOUNTER — Other Ambulatory Visit: Payer: Self-pay | Admitting: General Practice

## 2020-05-31 DIAGNOSIS — O039 Complete or unspecified spontaneous abortion without complication: Secondary | ICD-10-CM

## 2020-05-31 DIAGNOSIS — O0990 Supervision of high risk pregnancy, unspecified, unspecified trimester: Secondary | ICD-10-CM

## 2020-06-02 ENCOUNTER — Encounter: Payer: Medicaid Other | Admitting: Advanced Practice Midwife

## 2020-06-04 ENCOUNTER — Other Ambulatory Visit: Payer: Medicaid Other

## 2020-06-16 ENCOUNTER — Ambulatory Visit: Payer: Medicaid Other | Admitting: Obstetrics and Gynecology

## 2020-06-29 IMAGING — US US OB COMP LESS 14 WK
1 series · 13 of 28 positions shown · non-contrast
Comparison: None.

CLINICAL DATA: Pregnant patient in first-trimester pregnancy with
vaginal bleeding.

EXAM:
OBSTETRIC <14 WK US AND TRANSVAGINAL OB US
TECHNIQUE: Both transabdominal and transvaginal ultrasound examinations were
performed for complete evaluation of the gestation as well as the
maternal uterus, adnexal regions, and pelvic cul-de-sac.
Transvaginal technique was performed to assess early pregnancy.

[Series 1: us ob comp less 14 wk · 56 acquisitions, 13 frames shown]
[im 3/56]
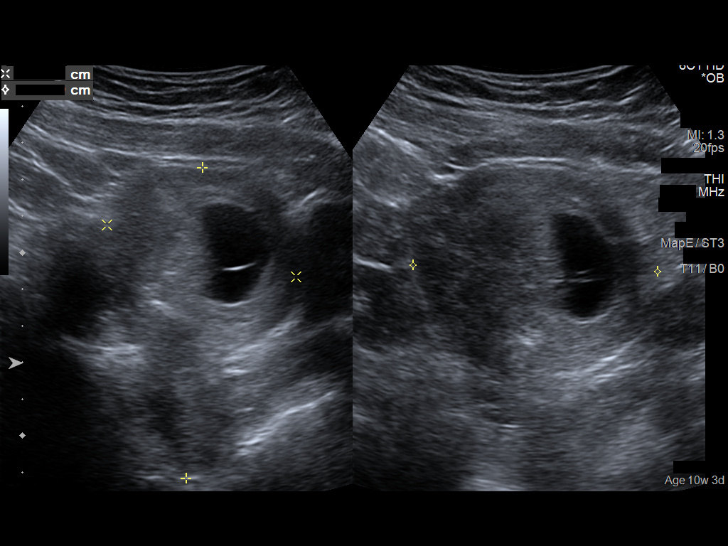
[im 7/56]
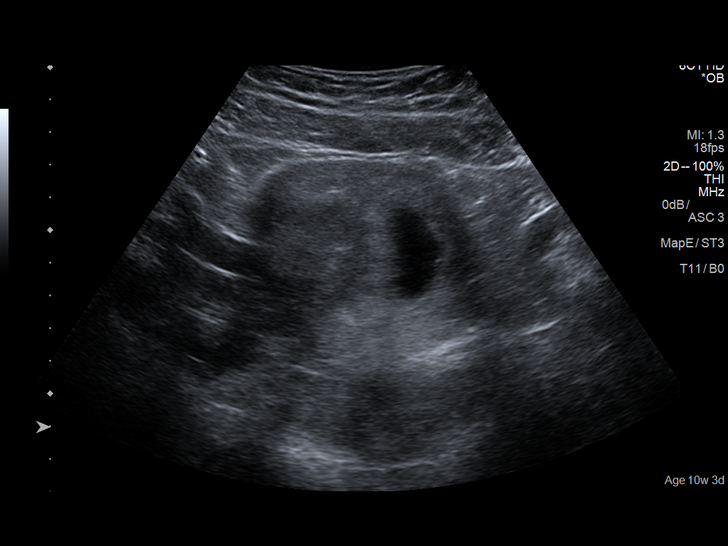
[im 11/56]
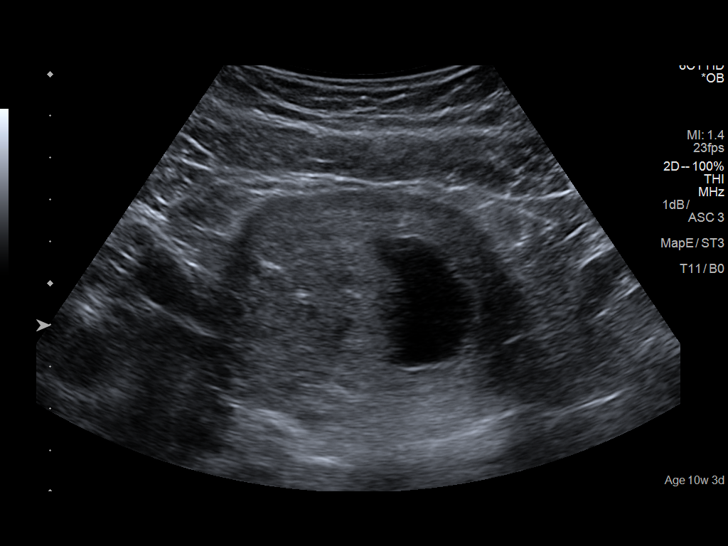
[im 15/56]
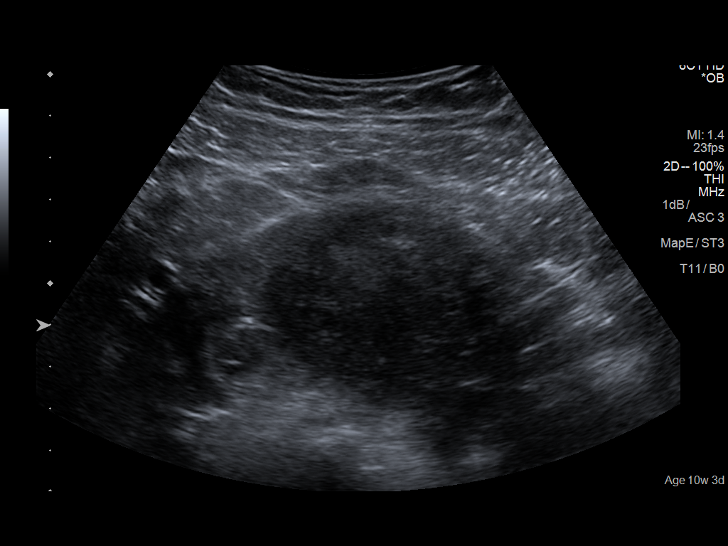
[im 19/56]
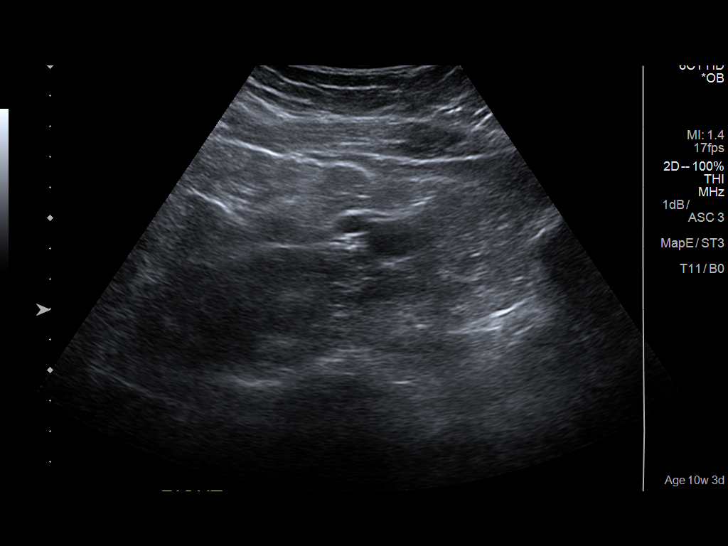
[im 23/56]
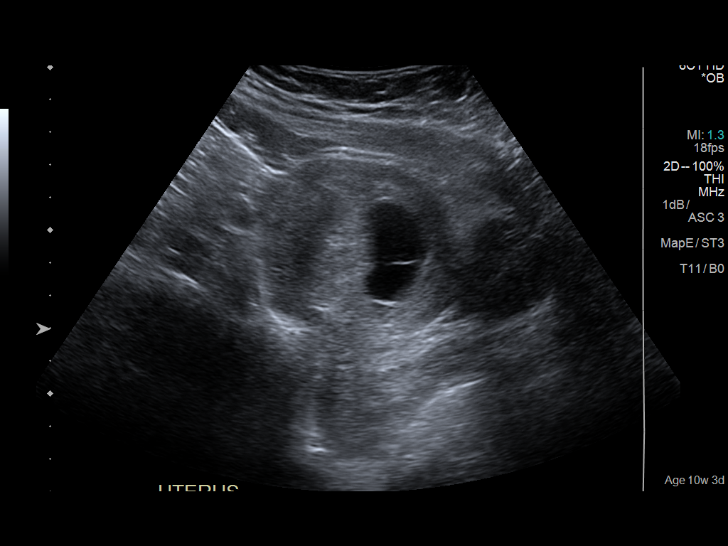
[im 29/56]
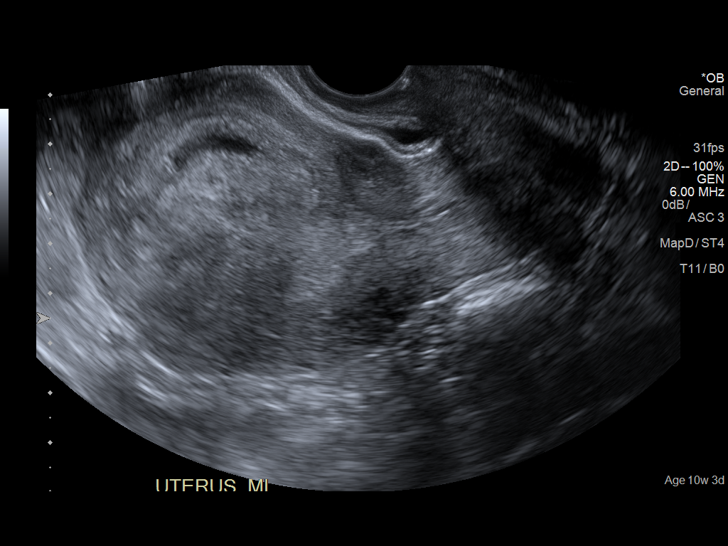
[im 33/56]
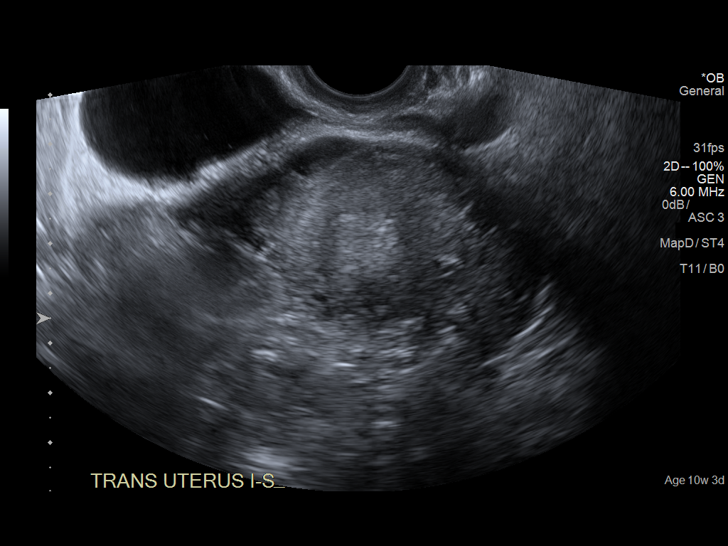
[im 37/56]
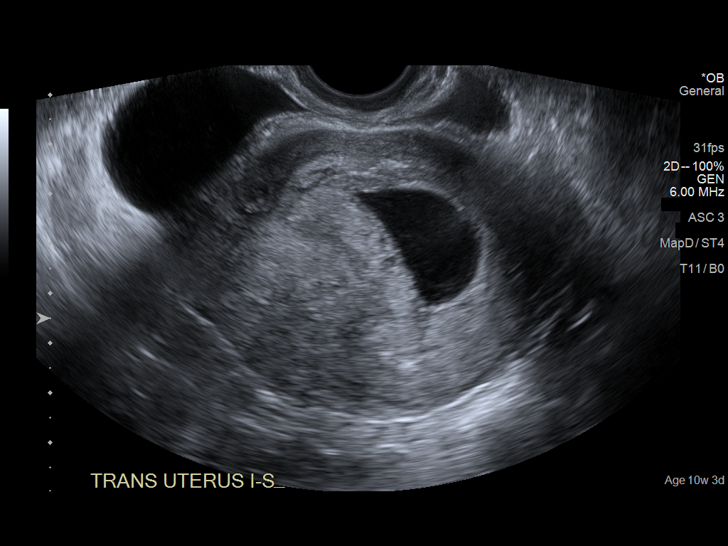
[im 41/56]
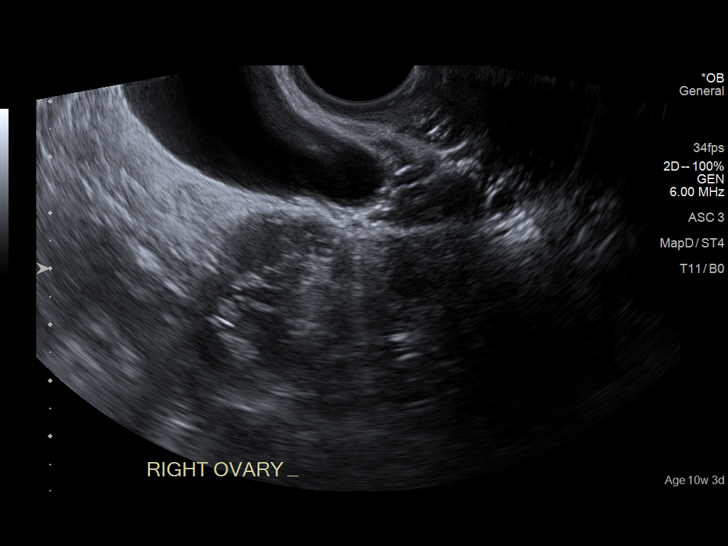
[im 45/56]
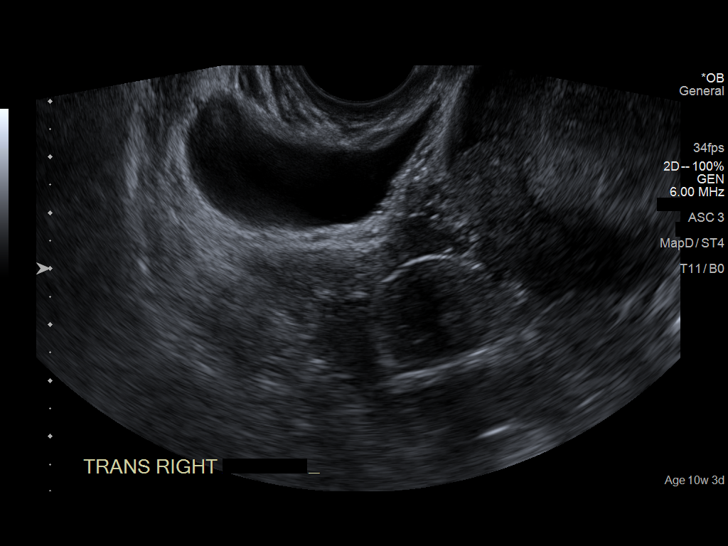
[im 49/56]
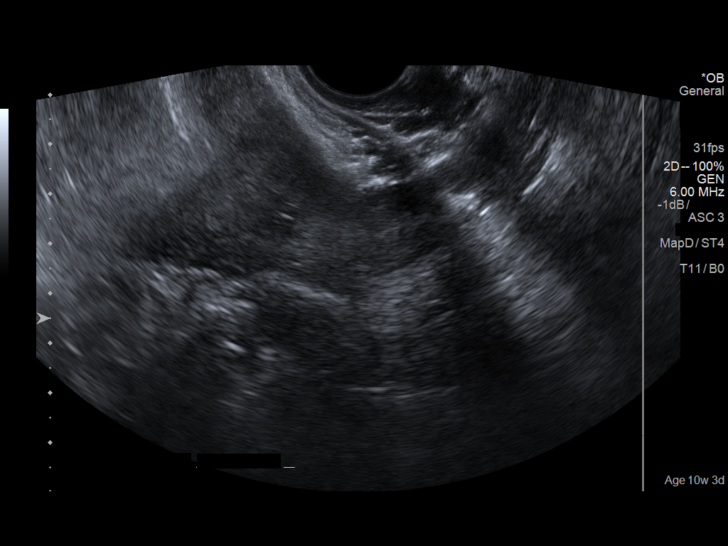
[im 53/56]
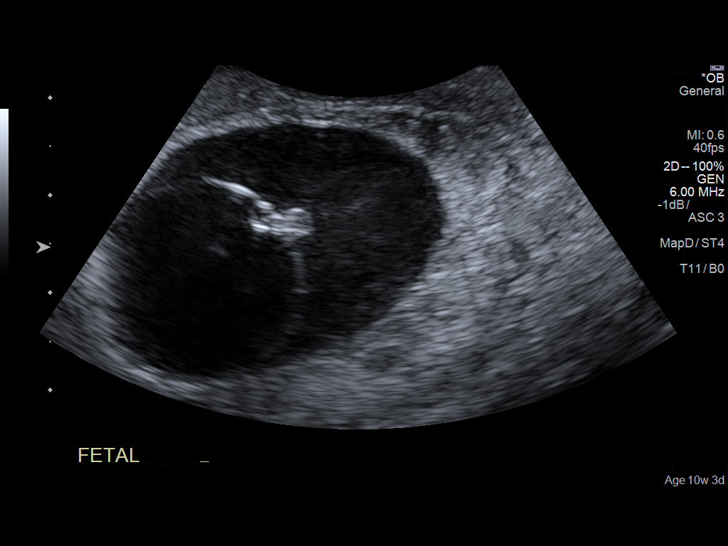

[13 of 28 positions shown; findings below may reference images not displayed]

FINDINGS: Intrauterine gestational sac: Single

Yolk sac:  Visualized.

Embryo:  Visualized.

Cardiac Activity: Not Visualized.

Heart Rate: Not applicable.

CRL:  3.4 mm   6 w   0 d                  US EDC: 12/07/2018

Subchorionic hemorrhage:  None visualized.

Maternal uterus/adnexae: Both ovaries are visualized and are normal.
No adnexal mass. No pelvic free fluid.
IMPRESSION: Intrauterine gestational sac containing a yolk sac and fetal pole
but no cardiac activity demonstrated. Findings are suspicious but
not yet definitive for failed pregnancy. Recommend follow-up US in
10-14 days for definitive diagnosis. This recommendation follows SRU
consensus guidelines: Diagnostic Criteria for Nonviable Pregnancy
Early in the First Trimester. N Engl J Med 4964; [DATE].

## 2020-09-11 NOTE — L&D Delivery Note (Addendum)
OB/GYN Faculty Practice Delivery Note  Crystal May is a 25 y.o. [id number] s/p SVD at [redacted]w[redacted]d. She was admitted for IOL for A1GDM.   ROM: 7h 12m with clear fluid GBS Status: negative  Delivery Date/Time: 08/19/2021 at 1522 Delivery: Called to room and patient was complete and pushing. Head delivered LOA. No nuchal cord present. Shoulder and body delivered in usual fashion. Infant with spontaneous cry, placed on mother's abdomen, dried and stimulated. Cord clamped x 2 after 1-minute delay, and cut by FOB under my direct supervision. Cord blood drawn. Placenta delivered spontaneously with gentle cord traction. Fundus firm with massage, manual lower uterine sweep/massage, and Pitocin. I&O red rubber cath with 50-75cc clear urine. Labia, perineum, vagina, and cervix were inspected, and the patient was found to have no lacerations.   Placenta: intact, 3VC - sent to L&D Complications: none Lacerations: none EBL: 100 cc Analgesia: epidural  Infant: viable female  APGARs 12, 79  Yves Dill, MD PGY1  ATTESTATION  I was present, gloved, and supervising throughout the delivery and agree with above documentation in the resident's note.  Allayne Stack, DO OB Fellow  [facility] [employer] [facility] (Faculty Practice) 08/19/2021, 4:04 PM

## 2021-02-01 ENCOUNTER — Ambulatory Visit (HOSPITAL_COMMUNITY)
Admission: EM | Admit: 2021-02-01 | Discharge: 2021-02-01 | Disposition: A | Discharge status: Home / Self Care | Payer: Medicaid Other | Attending: Internal Medicine | Admitting: Internal Medicine

## 2021-02-01 ENCOUNTER — Other Ambulatory Visit: Payer: Self-pay

## 2021-02-01 ENCOUNTER — Encounter (HOSPITAL_COMMUNITY): Payer: Self-pay

## 2021-02-01 DIAGNOSIS — Z3201 Encounter for pregnancy test, result positive: Secondary | ICD-10-CM | POA: Diagnosis not present

## 2021-02-01 DIAGNOSIS — O210 Mild hyperemesis gravidarum: Secondary | ICD-10-CM | POA: Diagnosis not present

## 2021-02-01 DIAGNOSIS — R112 Nausea with vomiting, unspecified: Secondary | ICD-10-CM

## 2021-02-01 DIAGNOSIS — Z3A10 10 weeks gestation of pregnancy: Secondary | ICD-10-CM | POA: Diagnosis not present

## 2021-02-01 LAB — POC URINE PREG, ED: Preg Test, Ur: POSITIVE — AB

## 2021-02-01 MED ORDER — DOXYLAMINE-PYRIDOXINE 10-10 MG PO TBEC
10.0000 mg | DELAYED_RELEASE_TABLET | Freq: Two times a day (BID) | ORAL | 0 refills | Status: DC | PRN
Start: 2021-02-01 — End: 2021-03-28

## 2021-02-01 MED ORDER — PREPLUS 27-1 MG PO TABS: 1.0000 | ORAL_TABLET | Freq: Every day | ORAL | 13 refills | Status: DC

## 2021-02-01 NOTE — ED Provider Notes (Signed)
[facility]    CSN: [id number] Arrival date & time: 02/01/21  0945      History   Chief Complaint Chief Complaint  Patient presents with  . Possible Pregnancy    HPI Crystal May is a 25 y.o. female.   HPI Patient presents today for confirmatory pregnancy test.  Patient pregnancy test at home which was positive and needs verification of a positive pregnancy in order to be seen by OB/GYN.  She endorses nausea. She is taking prenatal vitamins.  Last menstrual period was 11/19/2020.  She denies any bleeding. Past Medical History:  Diagnosis Date  . Anemia in pregnancy 04/30/2019     CBC Latest Ref Rng & Units 04/01/2019 12/31/2018 05/08/2018 WBC 3.4 - 10.8 x10E3/uL 8.5 8.8 6.6 Hemoglobin 11.1 - 15.9 g/dL 10.7(L) 12.0 11.5 Hematocrit 34.0 - 46.6 % 33.6(L) 36.8 34.8 Platelets 150 - 450 x10E3/uL 214 246 276    . Asthma   . Gestational diabetes   . Medical history non-contributory   . Seasonal allergies     Patient Active Problem List   Diagnosis Date Noted  . Postpartum care and examination 07/31/2019  . GDM, class A2 06/18/2019  . Abnormal fetal chromosomal analysis affecting antepartum care of mother 05/14/2019  . Obesity in pregnancy 04/30/2019  . Gestational diabetes 04/04/2019  . Alpha thalassemia trait 01/09/2019  . Rh negative state in antepartum period 12/31/2018  . Supervision of high risk pregnancy, antepartum 12/05/2018  . Asthma affecting pregnancy, antepartum 12/05/2018  . History of miscarriage, currently pregnant 12/05/2018    Past Surgical History:  Procedure Laterality Date  . KNEE SURGERY      OB History    Gravida  3   Para  1   Term  1   Preterm      AB  1   Living  1     SAB  1   IAB      Ectopic      Multiple  0   Live Births  1            Home Medications    Prior to Admission medications   Medication Sig Start Date End Date Taking? Authorizing Provider  ibuprofen (ADVIL) 600 MG tablet Take 1 tablet (600 mg  total) by mouth every 6 (six) hours as needed. 05/24/20   Rasch, Victorino Dike I, NP  Prenatal Vit-Fe Fumarate-FA (PREPLUS) 27-1 MG TABS Take 1 tablet by mouth daily. 05/28/20   Gerrit Heck, CNM    Family History Family History  Problem Relation Age of Onset  . Healthy Mother   . Healthy Father     Social History Social History   Tobacco Use  . Smoking status: Passive Smoke Exposure - Never Smoker  . Smokeless tobacco: Never Used  Vaping Use  . Vaping Use: Never used  Substance Use Topics  . Alcohol use: No  . Drug use: No     Allergies   Patient has no allergy information on record.   Review of Systems Review of Systems  Pertinent negatives listed in HPI  Physical Exam Triage Vital Signs ED Triage Vitals  Enc Vitals Group     BP 02/01/21 1056 116/74     Pulse Rate 02/01/21 1056 80     Resp 02/01/21 1056 17     Temp 02/01/21 1056 98.5 F (36.9 C)     Temp Source 02/01/21 1056 Oral     SpO2 02/01/21 1056 100 %     Weight --  Height --      Head Circumference --      Peak Flow --      Pain Score 02/01/21 1054 0     Pain Loc --      Pain Edu? --      Excl. in GC? --    No data found.  Updated Vital Signs BP 116/74   Pulse 80   Temp 98.5 F (36.9 C) (Oral)   Resp 17   LMP 11/19/2020 (Exact Date)   SpO2 100%   Visual Acuity Right Eye Distance:   Left Eye Distance:   Bilateral Distance:    Right Eye Near:   Left Eye Near:    Bilateral Near:     Physical Exam General appearance: Alert, cooperative,no distress Head: Normocephalic, without obvious abnormality, atraumatic Respiratory: Respirations even and unlabored, normal respiratory rate Heart: Rate and rhythm normal.  Extremities: No gross deformities Skin: Skin color, texture, turgor normal. No rashes seen  Psych: Appropriate mood and affect. Neurologic:   UC Treatments / Results  Labs (all labs ordered are listed, but only abnormal results are displayed) Labs Reviewed  POC URINE PREG,  ED - Abnormal; Notable for the following components:      Result Value   Preg Test, Ur POSITIVE (*)    All other components within normal limits    EKG   Radiology No results found.  Procedures Procedures (including critical care time)  Medications Ordered in UC Medications - No data to display  Initial Impression / Assessment and Plan / UC Course  I have reviewed the triage vital signs and the nursing notes.  Pertinent labs & imaging results that were available during my care of the patient were reviewed by me and considered in my medical decision making (see chart for details).     Urine pregnancy test positive.  Patient encouraged to continue prenatal vitamins.   For management of nausea doxylamine pyridoxine.  Contact women's to schedule new patient appointment   Final Clinical Impressions(s) / UC Diagnoses   Final diagnoses:  [redacted] weeks gestation of pregnancy  Nausea and vomiting, intractability of vomiting not specified, unspecified vomiting type   Discharge Instructions   None    ED Prescriptions    Medication Sig Dispense Auth. Provider   Prenatal Vit-Fe Fumarate-FA (PREPLUS) 27-1 MG TABS Take 1 tablet by mouth daily. 30 tablet Bing Neighbors, FNP   Doxylamine-Pyridoxine 10-10 MG TBEC Take 10 mg by mouth 2 (two) times daily as needed. 60 tablet Bing Neighbors, FNP     PDMP not reviewed this encounter.   Bing Neighbors, FNP 02/01/21 1158

## 2021-02-01 NOTE — ED Triage Notes (Signed)
Pt is here today to take a pregnancy test. She states she took an at home test that showed positive. She states she needs a confirmed pregnancy test in order to be seen at the [facility].

## 2021-02-09 ENCOUNTER — Telehealth (INDEPENDENT_AMBULATORY_CARE_PROVIDER_SITE_OTHER): Payer: Medicaid Other | Admitting: *Deleted

## 2021-02-09 DIAGNOSIS — O09299 Supervision of pregnancy with other poor reproductive or obstetric history, unspecified trimester: Secondary | ICD-10-CM | POA: Insufficient documentation

## 2021-02-09 DIAGNOSIS — Z3A00 Weeks of gestation of pregnancy not specified: Secondary | ICD-10-CM

## 2021-02-09 DIAGNOSIS — O99210 Obesity complicating pregnancy, unspecified trimester: Secondary | ICD-10-CM

## 2021-02-09 DIAGNOSIS — O0990 Supervision of high risk pregnancy, unspecified, unspecified trimester: Secondary | ICD-10-CM | POA: Insufficient documentation

## 2021-02-09 DIAGNOSIS — Z3480 Encounter for supervision of other normal pregnancy, unspecified trimester: Secondary | ICD-10-CM

## 2021-02-09 DIAGNOSIS — O99519 Diseases of the respiratory system complicating pregnancy, unspecified trimester: Secondary | ICD-10-CM

## 2021-02-09 DIAGNOSIS — Z8632 Personal history of gestational diabetes: Secondary | ICD-10-CM

## 2021-02-09 DIAGNOSIS — J45909 Unspecified asthma, uncomplicated: Secondary | ICD-10-CM

## 2021-02-09 DIAGNOSIS — Z3490 Encounter for supervision of normal pregnancy, unspecified, unspecified trimester: Secondary | ICD-10-CM | POA: Insufficient documentation

## 2021-02-09 NOTE — Progress Notes (Signed)
Patient was assessed and managed by nursing staff during this encounter. I have reviewed the chart and agree with the documentation and plan. I have also made any necessary editorial changes.  Warden Fillers, MD 02/09/2021 6:46 PM

## 2021-02-09 NOTE — Progress Notes (Addendum)
New OB Intake  I connected with  Crystal May on 02/09/21 at  8:15 AM EDT by MyChart virtual visit and verified that I am speaking with the correct person using two identifiers. Nurse is located at [facility] and pt is located at home.  I discussed the limitations, risks, security and privacy concerns of performing an evaluation and management service by telephone and the availability of in person appointments. I also discussed with the patient that there may be a patient responsible charge related to this service. The patient expressed understanding and agreed to proceed.  I explained I am completing New OB Intake today. We discussed her EDD of 08/16/21 that is based on LMP of 11/19/20. Pt is G4/P1021. I reviewed her allergies, medications, Medical/Surgical/OB history, and appropriate screenings. I informed her of [facility] services. Based on history, this is a/an uncomplicated pregnancy.  Patient Active Problem List   Diagnosis Date Noted  . Supervision of low-risk pregnancy 02/09/2021  . History of gestational diabetes mellitus (GDM) in prior pregnancy, currently pregnant 02/09/2021  . Obesity in pregnancy 04/30/2019  . Alpha thalassemia trait 01/09/2019  . Rh negative state in antepartum period 12/31/2018  . Asthma affecting pregnancy, antepartum 12/05/2018    Concerns addressed today  Delivery Plans:  Plans to deliver at [facility].   MyChart/Babyscripts MyChart access verified. I explained pt will have some visits in office and some virtually. Babyscripts instructions given and order placed. Patient states she did not receive email. I informed her I will reach out to Babyscripts and to check her email over the next few days.  Blood Pressure Cuff Patient states she may have blood pressure cuff from last pregnancy , will check and see and let us know if she does not.  Explained after first prenatal appt pt will check weekly and document in Babyscripts.  Anatomy US Explained first scheduled  [place] will be around 19 weeks. Anatomy US scheduled for 04/05/21 at 1015. Pt notified to arrive at 1000.  Labs Discussed Crystal May genetic screening with patient. Would like both Panorama and Horizon drawn at new OB visit. Routine prenatal labs needed.  Covid Vaccine Patient has not had the covid vaccine.   Social Determinants of Health . Food Insecurity: Patient denies food insecurity. . WIC Referral: Patient is interested in referral to [facility].  . Transportation: Patient denies transportation needs. . Childcare: Discussed no children allowed at ultrasound appointments. Offered childcare services; patient declines childcare services at this time.  First visit review I reviewed new OB appt with pt. I explained she will have a pelvic exam, ob bloodwork with genetic screening, and PAP smear. Explained pt will be seen by Dr. Donavan Foil at first visit; encounter routed to appropriate provider. Explained that patient will be seen by pregnancy navigator following visit with provider.  Crystal Tovey,RN 02/09/2021  8:59 AM

## 2021-02-28 ENCOUNTER — Other Ambulatory Visit: Payer: Self-pay | Admitting: Obstetrics and Gynecology

## 2021-02-28 ENCOUNTER — Ambulatory Visit (INDEPENDENT_AMBULATORY_CARE_PROVIDER_SITE_OTHER): Payer: Medicaid Other | Admitting: Obstetrics and Gynecology

## 2021-02-28 ENCOUNTER — Other Ambulatory Visit: Payer: Self-pay

## 2021-02-28 ENCOUNTER — Other Ambulatory Visit (HOSPITAL_COMMUNITY)
Admission: RE | Admit: 2021-02-28 | Discharge: 2021-02-28 | Disposition: A | Discharge status: Home / Self Care | Payer: Medicaid Other | Source: Ambulatory Visit | Attending: Obstetrics and Gynecology | Admitting: Obstetrics and Gynecology

## 2021-02-28 VITALS — BP 120/84 | HR 95 | Wt 181.0 lb

## 2021-02-28 DIAGNOSIS — O0990 Supervision of high risk pregnancy, unspecified, unspecified trimester: Secondary | ICD-10-CM | POA: Diagnosis not present

## 2021-02-28 DIAGNOSIS — O99519 Diseases of the respiratory system complicating pregnancy, unspecified trimester: Secondary | ICD-10-CM | POA: Diagnosis not present

## 2021-02-28 DIAGNOSIS — Z3A14 14 weeks gestation of pregnancy: Secondary | ICD-10-CM

## 2021-02-28 DIAGNOSIS — O26899 Other specified pregnancy related conditions, unspecified trimester: Secondary | ICD-10-CM | POA: Diagnosis not present

## 2021-02-28 DIAGNOSIS — O99210 Obesity complicating pregnancy, unspecified trimester: Secondary | ICD-10-CM

## 2021-02-28 DIAGNOSIS — O09299 Supervision of pregnancy with other poor reproductive or obstetric history, unspecified trimester: Secondary | ICD-10-CM

## 2021-02-28 DIAGNOSIS — Z6791 Unspecified blood type, Rh negative: Secondary | ICD-10-CM

## 2021-02-28 DIAGNOSIS — J45909 Unspecified asthma, uncomplicated: Secondary | ICD-10-CM

## 2021-02-28 DIAGNOSIS — D563 Thalassemia minor: Secondary | ICD-10-CM | POA: Diagnosis not present

## 2021-02-28 DIAGNOSIS — Z8632 Personal history of gestational diabetes: Secondary | ICD-10-CM

## 2021-02-28 MED ORDER — BLOOD PRESSURE KIT DEVI: 1.0000 | 0 refills | Status: DC | PRN

## 2021-02-28 NOTE — Progress Notes (Signed)
INITIAL PRENATAL VISIT NOTE  Subjective:  Crystal May is a 25 y.o. [id number] at 45w3dby sure LMP being seen today for her initial prenatal visit.  She has an obstetric history significant for oral medication controlled gestational diabetes. She has a medical history significant for asthma.  Patient reports no complaints.  Contractions: Not present. Vag. Bleeding: None.  Movement: Absent. Denies leaking of fluid.    Past Medical History:  Diagnosis Date   Anemia in pregnancy 04/30/2019     CBC Latest Ref Rng & Units 04/01/2019 12/31/2018 05/08/2018 WBC 3.4 - 10.8 x10E3/uL 8.5 8.8 6.6 Hemoglobin 11.1 - 15.9 g/dL 10.7(L) 12.0 11.5 Hematocrit 34.0 - 46.6 % 33.6(L) 36.8 34.8 Platelets 150 - 450 x10E3/uL 214 246 276     Asthma    Gestational diabetes    Medical history non-contributory    Seasonal allergies     Past Surgical History:  Procedure Laterality Date   KNEE SURGERY      OB History  Gravida Para Term Preterm AB Living  4 1 1   2 1   SAB IAB Ectopic Multiple Live Births  2     0 1    # Outcome Date GA Lbr Len/2nd Weight Sex Delivery Anes PTL Lv  4 Current           3 SAB 05/2020 158w0d       2 Term 06/19/19 3911w1d:03 / 06:17 7 lb 5.8 oz (3.34 kg) F Vag-Spont EPI  LIV     Birth Comments: GDM  1 SAB 05/04/18 [id number]       Social History   Socioeconomic History   Marital status: Single    Spouse name: Not on file   Number of children: Not on file   Years of education: Not on file   Highest education level: Not on file  Occupational History   Occupation: Host    Employer: [employer]  Tobacco Use   Smoking status: Passive Smoke Exposure - Never Smoker   Smokeless tobacco: Never  Vaping Use   Vaping Use: Never used  Substance and Sexual Activity   Alcohol use: Not Currently    Comment: occasionally   Drug use: No   Sexual activity: Yes    Birth control/protection: None  Other Topics Concern   Not on file  Social History Narrative   Not on  file   Social Determinants of Health   Financial Resource Strain: Not on file  Food Insecurity: No Food Insecurity   Worried About Running Out of Food in the Last Year: Never true   Ran Out of Food in the Last Year: Never true  Transportation Needs: No Transportation Needs   Lack of Transportation (Medical): No   Lack of Transportation (Non-Medical): No  Physical Activity: Not on file  Stress: Not on file  Social Connections: Not on file    Family History  Problem Relation Age of Onset   Healthy Mother    Healthy Father      Current Outpatient Medications:    Blood Pressure Monitoring (BLOOD PRESSURE KIT) DEVI, 1 Device by Does not apply route as needed., Disp: 1 each, Rfl: 0   Doxylamine-Pyridoxine 10-10 MG TBEC, Take 10 mg by mouth 2 (two) times daily as needed., Disp: 60 tablet, Rfl: 0   Prenatal Vit-Fe Fumarate-FA (PRENATAL VITAMINS PO), Take 2 tablets by mouth daily. otc, Disp: , Rfl:    Prenatal Vit-Fe Fumarate-FA (  PREPLUS) 27-1 MG TABS, Take 1 tablet by mouth daily. (Patient not taking: No sig reported), Disp: 30 tablet, Rfl: 13  No Known Allergies  Review of Systems: Negative except for what is mentioned in HPI.  Objective:   Vitals:   02/28/21 1500  BP: 120/84  Pulse: 95  Weight: 181 lb (82.1 kg)    Fetal Status: Fetal Heart Rate (bpm): [place] Fundal Height: 15 cm Movement: Absent     Physical Exam: BP 120/84   Pulse 95   Wt 181 lb (82.1 kg)   LMP 11/19/2020 (Exact Date)   BMI 33.11 kg/m  CONSTITUTIONAL: Well-developed, well-nourished female in no acute distress.  NEUROLOGIC: Alert and oriented to person, place, and time. Normal reflexes, muscle tone coordination. No cranial nerve deficit noted. PSYCHIATRIC: Normal mood and affect. Normal behavior. Normal judgment and thought content. SKIN: Skin is warm and dry. No rash noted. Not diaphoretic. No erythema. No pallor. HENT:  Normocephalic, atraumatic, External right and left ear normal. Oropharynx is clear  and moist EYES: Conjunctivae and EOM are normal.  NECK: Normal range of motion, supple, no masses CARDIOVASCULAR: Normal heart rate noted, regular rhythm RESPIRATORY: Effort and breath sounds normal, no problems with respiration noted BREASTS: deferred ABDOMEN: Soft, nontender, nondistended, gravid. GU: normal appearing external female genitalia, multiparous normal appearing cervix, scant white discharge in vagina, no lesions noted Bimanual: 15 weeks sized uterus, no adnexal tenderness or palpable lesions noted MUSCULOSKELETAL: Normal range of motion. EXT:  No edema and no tenderness. 2+ distal pulses.   Assessment and Plan:  Pregnancy: G4P1021 at 34w3dby LMP  1. Supervision of high risk pregnancy, antepartum Continue routine care - Genetic Screening - Culture, OB Urine - Hemoglobin A1c - CBC/D/Plt+RPR+Rh+ABO+RubIgG... - Cytology - PAP( [place]) - Cervicovaginal ancillary only( [place])  2. Asthma affecting pregnancy, antepartum   3. Rh negative state in antepartum period   4. Alpha thalassemia trait   5. Obesity in pregnancy   6. History of gestational diabetes mellitus (GDM) in prior pregnancy, currently pregnant Hold early 2 hour GTT until A1c returns, if close to abnormal would schedule early 2 hour, pt has 3 other risk factors - Hemoglobin A1c  7. [redacted] weeks gestation of pregnancy    Preterm labor symptoms and general obstetric precautions including but not limited to vaginal bleeding, contractions, leaking of fluid and fetal movement were reviewed in detail with the patient.  Please refer to After Visit Summary for other counseling recommendations.   Return in about 4 weeks (around 03/28/2021) for ROB, in person.  LGriffin Basil6/20/2022 3:44 PM

## 2021-03-01 ENCOUNTER — Telehealth: Payer: Self-pay

## 2021-03-01 LAB — CERVICOVAGINAL ANCILLARY ONLY
Bacterial Vaginitis (gardnerella): POSITIVE — AB
Candida Glabrata: NEGATIVE
Candida Vaginitis: NEGATIVE
Chlamydia: NEGATIVE
Comment: NEGATIVE
Comment: NEGATIVE
Comment: NEGATIVE
Comment: NEGATIVE
Comment: NEGATIVE
Comment: NORMAL
Neisseria Gonorrhea: NEGATIVE
Trichomonas: NEGATIVE

## 2021-03-01 LAB — POCT URINALYSIS DIP (DEVICE)
Bilirubin Urine: NEGATIVE
Glucose, UA: NEGATIVE mg/dL
Hgb urine dipstick: NEGATIVE
Leukocytes,Ua: NEGATIVE
Nitrite: NEGATIVE
Protein, ur: NEGATIVE mg/dL
Specific Gravity, Urine: 1.025 (ref 1.005–1.030)
Urobilinogen, UA: 0.2 mg/dL (ref 0.0–1.0)
pH: 7.5 (ref 5.0–8.0)

## 2021-03-01 MED ORDER — METRONIDAZOLE 500 MG PO TABS: 500.0000 mg | ORAL_TABLET | Freq: Two times a day (BID) | ORAL | 0 refills | Status: DC

## 2021-03-01 NOTE — Telephone Encounter (Signed)
-----   Message from Warden Fillers, MD sent at 03/01/2021 12:52 PM EDT ----- Vaginal swab normal other than BV noted, will offer treatment

## 2021-03-01 NOTE — Telephone Encounter (Signed)
Call placed to pt. Spoke with pt. Pt given results per Dr Donavan Foil. Pt verbalized understanding and agreeable to Rx Flagyl for treatment. Rx Flagyl sent to pharmacy on file per protocol.  Judeth Cornfield, RN

## 2021-03-02 LAB — CBC/D/PLT+RPR+RH+ABO+RUBIGG...
Antibody Screen: NEGATIVE
Basophils Absolute: 0 10*3/uL (ref 0.0–0.2)
Basos: 0 %
EOS (ABSOLUTE): 0.1 10*3/uL (ref 0.0–0.4)
Eos: 2 %
HCV Ab: <0.1 s/co ratio (ref 0.0–0.9)
HIV Screen 4th Generation wRfx: NONREACTIVE
Hematocrit: 39.2 % (ref 34.0–46.6)
Hemoglobin: 12.7 g/dL (ref 11.1–15.9)
Hepatitis B Surface Ag: NEGATIVE
Immature Grans (Abs): 0 10*3/uL (ref 0.0–0.1)
Immature Granulocytes: 0 %
Lymphocytes Absolute: 1.6 10*3/uL (ref 0.7–3.1)
Lymphs: 24 %
MCH: 25.7 pg — ABNORMAL LOW (ref 26.6–33.0)
MCHC: 32.4 g/dL (ref 31.5–35.7)
MCV: 79 fL (ref 79–97)
Monocytes Absolute: 0.3 10*3/uL (ref 0.1–0.9)
Monocytes: 4 %
Neutrophils Absolute: 4.6 10*3/uL (ref 1.4–7.0)
Neutrophils: 70 %
Platelets: 224 10*3/uL (ref 150–450)
RBC: 4.95 x10E6/uL (ref 3.77–5.28)
RDW: 14.6 % (ref 11.7–15.4)
RPR Ser Ql: NONREACTIVE
Rh Factor: NEGATIVE
Rubella Antibodies, IGG: 1.5 index (ref >0.99)
WBC: 6.6 10*3/uL (ref 3.4–10.8)

## 2021-03-02 LAB — CYTOLOGY - PAP
Comment: NEGATIVE
Diagnosis: NEGATIVE
High risk HPV: NEGATIVE

## 2021-03-02 LAB — CULTURE, OB URINE

## 2021-03-02 LAB — HEMOGLOBIN A1C
Est. average glucose Bld gHb Est-mCnc: 105 mg/dL
Hgb A1c MFr Bld: 5.3 % (ref 4.8–5.6)

## 2021-03-02 LAB — URINE CULTURE, OB REFLEX

## 2021-03-02 LAB — HCV INTERPRETATION

## 2021-03-11 ENCOUNTER — Encounter: Payer: Self-pay | Admitting: *Deleted

## 2021-03-28 ENCOUNTER — Ambulatory Visit (INDEPENDENT_AMBULATORY_CARE_PROVIDER_SITE_OTHER): Payer: Medicaid Other | Admitting: Obstetrics and Gynecology

## 2021-03-28 ENCOUNTER — Other Ambulatory Visit: Payer: Self-pay

## 2021-03-28 VITALS — BP 113/75 | HR 81 | Wt 186.0 lb

## 2021-03-28 DIAGNOSIS — O99210 Obesity complicating pregnancy, unspecified trimester: Secondary | ICD-10-CM

## 2021-03-28 DIAGNOSIS — D563 Thalassemia minor: Secondary | ICD-10-CM

## 2021-03-28 DIAGNOSIS — O26899 Other specified pregnancy related conditions, unspecified trimester: Secondary | ICD-10-CM

## 2021-03-28 DIAGNOSIS — Z8632 Personal history of gestational diabetes: Secondary | ICD-10-CM

## 2021-03-28 DIAGNOSIS — O99519 Diseases of the respiratory system complicating pregnancy, unspecified trimester: Secondary | ICD-10-CM

## 2021-03-28 DIAGNOSIS — O0990 Supervision of high risk pregnancy, unspecified, unspecified trimester: Secondary | ICD-10-CM

## 2021-03-28 DIAGNOSIS — Z3A18 18 weeks gestation of pregnancy: Secondary | ICD-10-CM | POA: Insufficient documentation

## 2021-03-28 DIAGNOSIS — O09299 Supervision of pregnancy with other poor reproductive or obstetric history, unspecified trimester: Secondary | ICD-10-CM

## 2021-03-28 DIAGNOSIS — J45909 Unspecified asthma, uncomplicated: Secondary | ICD-10-CM

## 2021-03-28 DIAGNOSIS — Z6791 Unspecified blood type, Rh negative: Secondary | ICD-10-CM

## 2021-03-28 NOTE — Progress Notes (Signed)
   PRENATAL VISIT NOTE  Subjective:  Crystal May is a 25 y.o. G4P1021 at [redacted]w[redacted]d being seen today for ongoing prenatal care.  She is currently monitored for the following issues for this low-risk pregnancy and has Asthma affecting pregnancy, antepartum; Rh negative state in antepartum period; Alpha thalassemia trait; Obesity in pregnancy; Supervision of high risk pregnancy, antepartum; History of gestational diabetes mellitus (GDM) in prior pregnancy, currently pregnant; [redacted] weeks gestation of pregnancy; and [redacted] weeks gestation of pregnancy on their problem list.  Patient doing well with no acute concerns today. She reports no complaints.  Contractions: Not present. Vag. Bleeding: None.  Movement: Present. Denies leaking of fluid.   The following portions of the patient's history were reviewed and updated as appropriate: allergies, current medications, past family history, past medical history, past social history, past surgical history and problem list. Problem list updated.  Objective:   Vitals:   03/28/21 0939  BP: 113/75  Pulse: 81  Weight: 186 lb (84.4 kg)    Fetal Status: Fetal Heart Rate (bpm): 150   Movement: Present     General:  Alert, oriented and cooperative. Patient is in no acute distress.  Skin: Skin is warm and dry. No rash noted.   Cardiovascular: Normal heart rate noted  Respiratory: Normal respiratory effort, no problems with respiration noted  Abdomen: Soft, gravid, appropriate for gestational age.  Pain/Pressure: Present     Pelvic: Cervical exam deferred        Extremities: Normal range of motion.  Edema: None  Mental Status:  Normal mood and affect. Normal behavior. Normal judgment and thought content.   Assessment and Plan:  Pregnancy: G4P1021 at [redacted]w[redacted]d  1. [redacted] weeks gestation of pregnancy   2. Supervision of high risk pregnancy, antepartum  - AFP, Serum, Open Spina Bifida  3. Asthma affecting pregnancy, antepartum No s/sx  4. Rh negative state in  antepartum period Rhogam at 26-26 weeks  5. Obesity in pregnancy   6. History of gestational diabetes mellitus (GDM) in prior pregnancy, currently pregnant Current A1c WNL  7. Alpha thalassemia trait   Preterm labor symptoms and general obstetric precautions including but not limited to vaginal bleeding, contractions, leaking of fluid and fetal movement were reviewed in detail with the patient.  Please refer to After Visit Summary for other counseling recommendations.   Return in about 4 weeks (around 04/25/2021) for [facility], in person.   Mariel Aloe, MD Faculty Attending Center for [facility]

## 2021-04-05 ENCOUNTER — Ambulatory Visit: Payer: Medicaid Other | Attending: Obstetrics and Gynecology

## 2021-04-05 ENCOUNTER — Other Ambulatory Visit: Payer: Self-pay

## 2021-04-05 ENCOUNTER — Ambulatory Visit: Payer: Medicaid Other | Admitting: *Deleted

## 2021-04-05 ENCOUNTER — Other Ambulatory Visit: Payer: Self-pay | Admitting: *Deleted

## 2021-04-05 ENCOUNTER — Encounter: Payer: Self-pay | Admitting: *Deleted

## 2021-04-05 VITALS — BP 121/69 | HR 79

## 2021-04-05 DIAGNOSIS — O99519 Diseases of the respiratory system complicating pregnancy, unspecified trimester: Secondary | ICD-10-CM

## 2021-04-05 DIAGNOSIS — O09299 Supervision of pregnancy with other poor reproductive or obstetric history, unspecified trimester: Secondary | ICD-10-CM

## 2021-04-05 DIAGNOSIS — O0990 Supervision of high risk pregnancy, unspecified, unspecified trimester: Secondary | ICD-10-CM

## 2021-04-05 DIAGNOSIS — O99210 Obesity complicating pregnancy, unspecified trimester: Secondary | ICD-10-CM

## 2021-04-05 DIAGNOSIS — Z6833 Body mass index (BMI) 33.0-33.9, adult: Secondary | ICD-10-CM

## 2021-04-05 DIAGNOSIS — Z3490 Encounter for supervision of normal pregnancy, unspecified, unspecified trimester: Secondary | ICD-10-CM | POA: Diagnosis present

## 2021-04-05 DIAGNOSIS — Z8632 Personal history of gestational diabetes: Secondary | ICD-10-CM

## 2021-04-05 DIAGNOSIS — J45909 Unspecified asthma, uncomplicated: Secondary | ICD-10-CM

## 2021-04-06 LAB — AFP, SERUM, OPEN SPINA BIFIDA
AFP MoM: 0.64
AFP Value: 29.6 ng/mL
Gest. Age on Collection Date: 18.4 weeks
Maternal Age At EDD: 23.8 yr
OSBR Risk 1 IN: 10000
Test Results:: NEGATIVE
Weight: 186 [lb_av]

## 2021-04-25 ENCOUNTER — Other Ambulatory Visit: Payer: Self-pay

## 2021-04-25 ENCOUNTER — Ambulatory Visit (INDEPENDENT_AMBULATORY_CARE_PROVIDER_SITE_OTHER): Payer: Medicaid Other | Admitting: Medical

## 2021-04-25 ENCOUNTER — Encounter: Payer: Self-pay | Admitting: Medical

## 2021-04-25 VITALS — BP 115/71 | HR 106 | Wt 181.5 lb

## 2021-04-25 DIAGNOSIS — J45909 Unspecified asthma, uncomplicated: Secondary | ICD-10-CM

## 2021-04-25 DIAGNOSIS — O99519 Diseases of the respiratory system complicating pregnancy, unspecified trimester: Secondary | ICD-10-CM

## 2021-04-25 DIAGNOSIS — D563 Thalassemia minor: Secondary | ICD-10-CM

## 2021-04-25 DIAGNOSIS — O26899 Other specified pregnancy related conditions, unspecified trimester: Secondary | ICD-10-CM

## 2021-04-25 DIAGNOSIS — Z6791 Unspecified blood type, Rh negative: Secondary | ICD-10-CM

## 2021-04-25 DIAGNOSIS — O09299 Supervision of pregnancy with other poor reproductive or obstetric history, unspecified trimester: Secondary | ICD-10-CM

## 2021-04-25 DIAGNOSIS — Z8632 Personal history of gestational diabetes: Secondary | ICD-10-CM

## 2021-04-25 DIAGNOSIS — O219 Vomiting of pregnancy, unspecified: Secondary | ICD-10-CM

## 2021-04-25 DIAGNOSIS — O0990 Supervision of high risk pregnancy, unspecified, unspecified trimester: Secondary | ICD-10-CM

## 2021-04-25 DIAGNOSIS — O99210 Obesity complicating pregnancy, unspecified trimester: Secondary | ICD-10-CM

## 2021-04-25 DIAGNOSIS — Z3A22 22 weeks gestation of pregnancy: Secondary | ICD-10-CM

## 2021-04-25 MED ORDER — ONDANSETRON HCL 4 MG PO TABS: 4.0000 mg | ORAL_TABLET | Freq: Four times a day (QID) | ORAL | 0 refills | Status: DC

## 2021-04-25 NOTE — Progress Notes (Signed)
   PRENATAL VISIT NOTE  Subjective:  Crystal May is a 25 y.o. G4P1021 at 28w3dbeing seen today for ongoing prenatal care.  She is currently monitored for the following issues for this high-risk pregnancy and has Asthma affecting pregnancy, antepartum; Rh negative state in antepartum period; Alpha thalassemia trait; Obesity in pregnancy; Supervision of high risk pregnancy, antepartum; and History of gestational diabetes mellitus (GDM) in prior pregnancy, currently pregnant on their problem list.  Patient reports no complaints.  Contractions: Not present. Vag. Bleeding: None.  Movement: Present. Denies leaking of fluid.   The following portions of the patient's history were reviewed and updated as appropriate: allergies, current medications, past family history, past medical history, past social history, past surgical history and problem list.   Objective:   Vitals:   04/25/21 1457  BP: 115/71  Pulse: (!) 106  Weight: 181 lb 8 oz (82.3 kg)    Fetal Status: Fetal Heart Rate (bpm): 152 Fundal Height: 23 cm Movement: Present     General:  Alert, oriented and cooperative. Patient is in no acute distress.  Skin: Skin is warm and dry. No rash noted.   Cardiovascular: Normal heart rate noted  Respiratory: Normal respiratory effort, no problems with respiration noted  Abdomen: Soft, gravid, appropriate for gestational age.  Pain/Pressure: Present     Pelvic: Cervical exam deferred        Extremities: Normal range of motion.  Edema: Trace  Mental Status: Normal mood and affect. Normal behavior. Normal judgment and thought content.   Assessment and Plan:  Pregnancy: G4P1021 at 269w3d. Supervision of high risk pregnancy, antepartum - Doing well - Anticipatory guidance for GTT, CBC, HIV, RPR, TDAP and Rhogam at next visit discussed   2. Alpha thalassemia trait - Natera partner kit given today   3. Obesity in pregnancy  4. History of gestational diabetes mellitus (GDM) in prior  pregnancy, currently pregnant - GTT at next visit  5. Rh negative state in antepartum period - Rhogam at next visit   6. Asthma affecting pregnancy, antepartum - Has not needed inhaler recently   7. [redacted] weeks gestation of pregnancy  8. Nausea and vomiting during pregnancy - ondansetron (ZOFRAN) 4 MG tablet; Take 1 tablet (4 mg total) by mouth every 6 (six) hours.  Dispense: 20 tablet; Refill: 0  Preterm labor symptoms and general obstetric precautions including but not limited to vaginal bleeding, contractions, leaking of fluid and fetal movement were reviewed in detail with the patient. Please refer to After Visit Summary for other counseling recommendations.   Return in about 4 weeks (around 05/23/2021) for LOB, 28 week labs (fasting), any provider, In-Person.  Future Appointments  Date Time Provider [place]8/23/2022 10:00 AM [facility]/23/2022 10:15 AM [facility]-MFC US2 [facility]9/08/2021  8:50 AM [facility]WOCA LAB [facility]9/08/2021  9:55 AM [place], TeRona RavensNP [facility]CWH [facility]  JuKerry HoughPA-C

## 2021-05-03 ENCOUNTER — Ambulatory Visit: Payer: Medicaid Other | Attending: Obstetrics

## 2021-05-03 ENCOUNTER — Ambulatory Visit: Payer: Medicaid Other | Admitting: *Deleted

## 2021-05-03 ENCOUNTER — Encounter: Payer: Self-pay | Admitting: *Deleted

## 2021-05-03 ENCOUNTER — Other Ambulatory Visit: Payer: Self-pay

## 2021-05-03 ENCOUNTER — Other Ambulatory Visit: Payer: Self-pay | Admitting: Obstetrics and Gynecology

## 2021-05-03 VITALS — BP 111/68 | HR 75

## 2021-05-03 DIAGNOSIS — E669 Obesity, unspecified: Secondary | ICD-10-CM

## 2021-05-03 DIAGNOSIS — O99212 Obesity complicating pregnancy, second trimester: Secondary | ICD-10-CM | POA: Insufficient documentation

## 2021-05-03 DIAGNOSIS — O360120 Maternal care for anti-D [Rh] antibodies, second trimester, not applicable or unspecified: Secondary | ICD-10-CM

## 2021-05-03 DIAGNOSIS — Z148 Genetic carrier of other disease: Secondary | ICD-10-CM | POA: Insufficient documentation

## 2021-05-03 DIAGNOSIS — O09299 Supervision of pregnancy with other poor reproductive or obstetric history, unspecified trimester: Secondary | ICD-10-CM

## 2021-05-03 DIAGNOSIS — O0990 Supervision of high risk pregnancy, unspecified, unspecified trimester: Secondary | ICD-10-CM

## 2021-05-03 DIAGNOSIS — Z3A23 23 weeks gestation of pregnancy: Secondary | ICD-10-CM | POA: Insufficient documentation

## 2021-05-03 DIAGNOSIS — Z362 Encounter for other antenatal screening follow-up: Secondary | ICD-10-CM | POA: Insufficient documentation

## 2021-05-03 DIAGNOSIS — Z6833 Body mass index (BMI) 33.0-33.9, adult: Secondary | ICD-10-CM

## 2021-05-03 DIAGNOSIS — Z8632 Personal history of gestational diabetes: Secondary | ICD-10-CM | POA: Insufficient documentation

## 2021-05-20 ENCOUNTER — Other Ambulatory Visit: Payer: Self-pay

## 2021-05-20 DIAGNOSIS — O0990 Supervision of high risk pregnancy, unspecified, unspecified trimester: Secondary | ICD-10-CM

## 2021-05-23 ENCOUNTER — Other Ambulatory Visit: Payer: Medicaid Other

## 2021-05-23 ENCOUNTER — Ambulatory Visit (INDEPENDENT_AMBULATORY_CARE_PROVIDER_SITE_OTHER): Payer: Medicaid Other | Admitting: Nurse Practitioner

## 2021-05-23 ENCOUNTER — Other Ambulatory Visit: Payer: Self-pay

## 2021-05-23 VITALS — BP 121/76 | HR 91 | Wt 187.9 lb

## 2021-05-23 DIAGNOSIS — O0990 Supervision of high risk pregnancy, unspecified, unspecified trimester: Secondary | ICD-10-CM

## 2021-05-23 DIAGNOSIS — Z23 Encounter for immunization: Secondary | ICD-10-CM

## 2021-05-23 DIAGNOSIS — Z8632 Personal history of gestational diabetes: Secondary | ICD-10-CM

## 2021-05-23 DIAGNOSIS — Z3A26 26 weeks gestation of pregnancy: Secondary | ICD-10-CM

## 2021-05-23 DIAGNOSIS — O09299 Supervision of pregnancy with other poor reproductive or obstetric history, unspecified trimester: Secondary | ICD-10-CM

## 2021-05-23 DIAGNOSIS — O26899 Other specified pregnancy related conditions, unspecified trimester: Secondary | ICD-10-CM

## 2021-05-23 DIAGNOSIS — D563 Thalassemia minor: Secondary | ICD-10-CM

## 2021-05-23 DIAGNOSIS — Z6791 Unspecified blood type, Rh negative: Secondary | ICD-10-CM

## 2021-05-23 NOTE — Progress Notes (Signed)
    Subjective:  Crystal May is a 25 y.o. G4P1021 at [redacted]w[redacted]d being seen today for ongoing prenatal care.  She is currently monitored for the following issues for this high-risk pregnancy and has Asthma affecting pregnancy, antepartum; Rh negative state in antepartum period; Alpha thalassemia trait; Obesity in pregnancy; Supervision of high risk pregnancy, antepartum; and History of gestational diabetes mellitus (GDM) in prior pregnancy, currently pregnant on their problem list.  Patient reports no complaints.  Contractions: Not present. Vag. Bleeding: None.  Movement: Present. Denies leaking of fluid.   The following portions of the patient's history were reviewed and updated as appropriate: allergies, current medications, past family history, past medical history, past social history, past surgical history and problem list. Problem list updated.  Objective:   Vitals:   05/23/21 0914  BP: 121/76  Pulse: 91  Weight: 187 lb 14.4 oz (85.2 kg)    Fetal Status: Fetal Heart Rate (bpm): 145 Fundal Height: 27 cm Movement: Present     General:  Alert, oriented and cooperative. Patient is in no acute distress.  Skin: Skin is warm and dry. No rash noted.   Cardiovascular: Normal heart rate noted  Respiratory: Normal respiratory effort, no problems with respiration noted  Abdomen: Soft, gravid, appropriate for gestational age. Pain/Pressure: Absent     Pelvic:  Cervical exam deferred        Extremities: Normal range of motion.  Edema: None  Mental Status: Normal mood and affect. Normal behavior. Normal judgment and thought content.   Urinalysis:      Assessment and Plan:  Pregnancy: G4P1021 at [redacted]w[redacted]d  1. Supervision of high risk pregnancy, antepartum Doing well. Glucola done today.  Got TDAP and flu  - Tdap vaccine greater than or equal to 7yo IM - Flu Vaccine QUAD 49mo+IM (Fluarix, Fluzone & Alfiuria Quad PF)  2. Alpha thalassemia trait Partner did not complete partner testing  3.  History of gestational diabetes mellitus (GDM) in prior pregnancy, currently pregnant   4. Rh negative state in antepartum period Will get at next visit  5. [redacted] weeks gestation of pregnancy   Preterm labor symptoms and general obstetric precautions including but not limited to vaginal bleeding, contractions, leaking of fluid and fetal movement were reviewed in detail with the patient. Please refer to After Visit Summary for other counseling recommendations.  Return in about 2 weeks (around 06/06/2021) for in person ROB.  Nolene Bernheim, RN, MSN, NP-BC Nurse Practitioner, [facility] for [employer], [facility] 05/23/2021 12:14 PM

## 2021-05-24 ENCOUNTER — Other Ambulatory Visit: Payer: Self-pay | Admitting: Nurse Practitioner

## 2021-05-24 ENCOUNTER — Encounter: Payer: Self-pay | Admitting: Nurse Practitioner

## 2021-05-24 DIAGNOSIS — O24410 Gestational diabetes mellitus in pregnancy, diet controlled: Secondary | ICD-10-CM

## 2021-05-24 DIAGNOSIS — O24419 Gestational diabetes mellitus in pregnancy, unspecified control: Secondary | ICD-10-CM | POA: Insufficient documentation

## 2021-05-24 LAB — CBC
Hematocrit: 34 % (ref 34.0–46.6)
Hemoglobin: 11.2 g/dL (ref 11.1–15.9)
MCH: 25.8 pg — ABNORMAL LOW (ref 26.6–33.0)
MCHC: 32.9 g/dL (ref 31.5–35.7)
MCV: 78 fL — ABNORMAL LOW (ref 79–97)
Platelets: 164 10*3/uL (ref 150–450)
RBC: 4.34 x10E6/uL (ref 3.77–5.28)
RDW: 13.4 % (ref 11.7–15.4)
WBC: 6 10*3/uL (ref 3.4–10.8)

## 2021-05-24 LAB — GLUCOSE TOLERANCE, 2 HOURS W/ 1HR
Glucose, 1 hour: 175 mg/dL (ref 65–179)
Glucose, 2 hour: 103 mg/dL (ref 65–152)
Glucose, Fasting: 92 mg/dL — ABNORMAL HIGH (ref 65–91)

## 2021-05-24 LAB — HIV ANTIBODY (ROUTINE TESTING W REFLEX): HIV Screen 4th Generation wRfx: NONREACTIVE

## 2021-05-24 LAB — RPR: RPR Ser Ql: NONREACTIVE

## 2021-05-24 LAB — ANTIBODY SCREEN: Antibody Screen: NEGATIVE

## 2021-06-07 ENCOUNTER — Ambulatory Visit (INDEPENDENT_AMBULATORY_CARE_PROVIDER_SITE_OTHER): Payer: Medicaid Other | Admitting: Family Medicine

## 2021-06-07 ENCOUNTER — Encounter: Payer: Self-pay | Admitting: Family Medicine

## 2021-06-07 ENCOUNTER — Other Ambulatory Visit: Payer: Self-pay

## 2021-06-07 DIAGNOSIS — O0990 Supervision of high risk pregnancy, unspecified, unspecified trimester: Secondary | ICD-10-CM

## 2021-06-07 DIAGNOSIS — O99210 Obesity complicating pregnancy, unspecified trimester: Secondary | ICD-10-CM

## 2021-06-07 DIAGNOSIS — O26899 Other specified pregnancy related conditions, unspecified trimester: Secondary | ICD-10-CM

## 2021-06-07 DIAGNOSIS — Z6791 Unspecified blood type, Rh negative: Secondary | ICD-10-CM | POA: Diagnosis not present

## 2021-06-07 DIAGNOSIS — Z3A28 28 weeks gestation of pregnancy: Secondary | ICD-10-CM

## 2021-06-07 DIAGNOSIS — O24419 Gestational diabetes mellitus in pregnancy, unspecified control: Secondary | ICD-10-CM

## 2021-06-07 MED ORDER — ACCU-CHEK GUIDE W/DEVICE KIT: 1.0000 | PACK | Freq: Four times a day (QID) | 0 refills | Status: DC

## 2021-06-07 MED ORDER — ACCU-CHEK SOFTCLIX LANCETS MISC: 12 refills | Status: DC

## 2021-06-07 MED ORDER — RHO D IMMUNE GLOBULIN 1500 UNIT/2ML IJ SOSY
300.0000 ug | PREFILLED_SYRINGE | Freq: Once | INTRAMUSCULAR | Status: AC
  Administered 2021-06-07: 300 ug via INTRAMUSCULAR

## 2021-06-07 MED ORDER — GLUCOSE BLOOD VI STRP
ORAL_STRIP | 12 refills | Status: DC
Start: 2021-06-07 — End: 2021-08-20

## 2021-06-07 NOTE — Progress Notes (Signed)
PRENATAL VISIT NOTE  Subjective:  Crystal May is a 25 y.o. G4P1021 at 69w4dbeing seen today for ongoing prenatal care.  She is currently monitored for the following issues for this high-risk pregnancy and has Asthma affecting pregnancy, antepartum; Rh negative state in antepartum period; Alpha thalassemia trait; Obesity in pregnancy; Supervision of high risk pregnancy, antepartum; History of gestational diabetes mellitus (GDM) in prior pregnancy, currently pregnant; and Gestational diabetes on their problem list.  Patient reports no complaints.  Contractions: Not present. Vag. Bleeding: None.  Movement: Present. Denies leaking of fluid.   She states that someone called her about meeting with a nutritionist. She only remembers doing this when she had GDM with prior pregnancy. She has not discussed recent GTT results with provider yet. She would like to know if her results mean that she has GDM with her current pregnancy.  The following portions of the patient's history were reviewed and updated as appropriate: allergies, current medications, past family history, past medical history, past social history, past surgical history and problem list.   Objective:   Vitals:   06/07/21 1133  BP: 111/68  Pulse: 87  Weight: 189 lb 9.6 oz (86 kg)    Fetal Status: Fetal Heart Rate (bpm): 150 Fundal Height: 29 cm Movement: Present     General:  Alert, oriented and cooperative. Patient is in no acute distress.  Skin: Skin is warm and dry. No rash noted.   Cardiovascular: Normal heart rate noted.  Respiratory: Normal respiratory effort, no problems with respiration noted.  Abdomen: Soft, gravid, appropriate for gestational age.  Pain/Pressure: Absent     Pelvic: Cervical exam deferred.        Extremities: Normal range of motion.  Edema: None  Mental Status: Normal mood and affect. Normal behavior. Normal judgment and thought content.   Assessment and Plan:  Pregnancy: G4P1021 at 24w4d1.  Supervision of high risk pregnancy, antepartum 2. [redacted] weeks gestation of pregnancy Progressing well. No complaints today. FH and FHT within normal limits. 28 week labs reviewed. Normal aside from GTT as discussed below. Follow up for next prenatal visit in 2 weeks.   3. Rh negative state in antepartum period Administered and tolerated without complication.  - rho (d) immune globulin (RHIG/RHOPHYLAC) injection 300 mcg  4. Gestational diabetes mellitus (GDM), antepartum, gestational diabetes method of control unspecified Patient meets criteria for GDM based on GTT results with fasting glucose of 92. Patient provided with glucose log and supplies ordered for glucose testing. Counseled about dietary modifications to help maintain adequate glycemic control. Will reassess at next visit. - Accu-Chek Softclix Lancets lancets; Use as instructed  Dispense: 100 each; Refill: 12 - glucose blood test strip; Use as instructed  Dispense: 100 each; Refill: 12 - Blood Glucose Monitoring Suppl (ACCU-CHEK GUIDE) w/Device KIT; 1 Device by Does not apply route in the morning, at noon, in the evening, and at bedtime.  Dispense: 1 kit; Refill: 0  5. Obesity in pregnancy Follow up growth USKoreacheduled.  Preterm labor symptoms and general obstetric precautions including but not limited to vaginal bleeding, contractions, leaking of fluid and fetal movement were reviewed in detail with the patient.  Please refer to After Visit Summary for other counseling recommendations.   Return in about 2 weeks (around 06/21/2021) for follow up high risk OB visit.  Future Appointments  Date Time Provider DeWhite [place]10/07/2021  1:55 PM EcClarnce FlockMD WMB[facility]07/04/2021  9:30 AM WMC-MFC NURSE [facility]  [facility]  07/04/2021  9:45 AM WMC-MFC US5 WMC-MFCUS WMC    Genia Del, MD

## 2021-06-21 ENCOUNTER — Ambulatory Visit (INDEPENDENT_AMBULATORY_CARE_PROVIDER_SITE_OTHER): Payer: Medicaid Other | Admitting: Family Medicine

## 2021-06-21 ENCOUNTER — Encounter: Payer: Self-pay | Admitting: Family Medicine

## 2021-06-21 ENCOUNTER — Other Ambulatory Visit: Payer: Self-pay

## 2021-06-21 VITALS — BP 118/77 | HR 94 | Wt 190.0 lb

## 2021-06-21 DIAGNOSIS — J45909 Unspecified asthma, uncomplicated: Secondary | ICD-10-CM

## 2021-06-21 DIAGNOSIS — Z6791 Unspecified blood type, Rh negative: Secondary | ICD-10-CM

## 2021-06-21 DIAGNOSIS — O26899 Other specified pregnancy related conditions, unspecified trimester: Secondary | ICD-10-CM

## 2021-06-21 DIAGNOSIS — O0990 Supervision of high risk pregnancy, unspecified, unspecified trimester: Secondary | ICD-10-CM

## 2021-06-21 DIAGNOSIS — O99519 Diseases of the respiratory system complicating pregnancy, unspecified trimester: Secondary | ICD-10-CM

## 2021-06-21 DIAGNOSIS — O24410 Gestational diabetes mellitus in pregnancy, diet controlled: Secondary | ICD-10-CM

## 2021-06-21 DIAGNOSIS — O99210 Obesity complicating pregnancy, unspecified trimester: Secondary | ICD-10-CM

## 2021-06-21 NOTE — Patient Instructions (Signed)

## 2021-06-21 NOTE — Progress Notes (Signed)
   Subjective:  Crystal May is a 25 y.o. G4P1021 at [redacted]w[redacted]d being seen today for ongoing prenatal care.  She is currently monitored for the following issues for this high-risk pregnancy and has Asthma affecting pregnancy, antepartum; Rh negative state in antepartum period; Alpha thalassemia trait; Obesity in pregnancy; Supervision of high risk pregnancy, antepartum; History of gestational diabetes mellitus (GDM) in prior pregnancy, currently pregnant; and Gestational diabetes on their problem list.  Patient reports no complaints.  Contractions: Irritability. Vag. Bleeding: None.  Movement: Present. Denies leaking of fluid.   The following portions of the patient's history were reviewed and updated as appropriate: allergies, current medications, past family history, past medical history, past social history, past surgical history and problem list. Problem list updated.  Objective:   Vitals:   06/21/21 1402  BP: 118/77  Pulse: 94  Weight: 190 lb (86.2 kg)    Fetal Status: Fetal Heart Rate (bpm): 154   Movement: Present     General:  Alert, oriented and cooperative. Patient is in no acute distress.  Skin: Skin is warm and dry. No rash noted.   Cardiovascular: Normal heart rate noted  Respiratory: Normal respiratory effort, no problems with respiration noted  Abdomen: Soft, gravid, appropriate for gestational age. Pain/Pressure: Present     Pelvic: Vag. Bleeding: None     Cervical exam deferred        Extremities: Normal range of motion.  Edema: Trace  Mental Status: Normal mood and affect. Normal behavior. Normal judgment and thought content.   Urinalysis:      Assessment and Plan:  Pregnancy: G4P1021 at [redacted]w[redacted]d  1. Supervision of high risk pregnancy, antepartum BP and FHR normal  2. Diet controlled gestational diabetes mellitus (GDM) in third trimester Just got testing supplies Review log at next visit  3. Rh negative state in antepartum period Received rhogam last visit  4.  Obesity in pregnancy   5. Asthma affecting pregnancy, antepartum   Preterm labor symptoms and general obstetric precautions including but not limited to vaginal bleeding, contractions, leaking of fluid and fetal movement were reviewed in detail with the patient. Please refer to After Visit Summary for other counseling recommendations.  Return in 2 weeks (on 07/05/2021) for [facility], ob visit.   Crystal Maples, MD

## 2021-07-04 ENCOUNTER — Other Ambulatory Visit: Payer: Self-pay

## 2021-07-04 ENCOUNTER — Other Ambulatory Visit: Payer: Self-pay | Admitting: *Deleted

## 2021-07-04 ENCOUNTER — Ambulatory Visit: Payer: Medicaid Other | Attending: Obstetrics and Gynecology

## 2021-07-04 ENCOUNTER — Ambulatory Visit: Payer: Medicaid Other | Admitting: *Deleted

## 2021-07-04 ENCOUNTER — Encounter: Payer: Self-pay | Admitting: *Deleted

## 2021-07-04 VITALS — BP 110/70 | HR 83

## 2021-07-04 DIAGNOSIS — O360130 Maternal care for anti-D [Rh] antibodies, third trimester, not applicable or unspecified: Secondary | ICD-10-CM | POA: Diagnosis not present

## 2021-07-04 DIAGNOSIS — E669 Obesity, unspecified: Secondary | ICD-10-CM

## 2021-07-04 DIAGNOSIS — O99212 Obesity complicating pregnancy, second trimester: Secondary | ICD-10-CM | POA: Insufficient documentation

## 2021-07-04 DIAGNOSIS — O24410 Gestational diabetes mellitus in pregnancy, diet controlled: Secondary | ICD-10-CM

## 2021-07-04 DIAGNOSIS — O99213 Obesity complicating pregnancy, third trimester: Secondary | ICD-10-CM | POA: Diagnosis not present

## 2021-07-04 DIAGNOSIS — Z8632 Personal history of gestational diabetes: Secondary | ICD-10-CM | POA: Diagnosis present

## 2021-07-04 DIAGNOSIS — O09299 Supervision of pregnancy with other poor reproductive or obstetric history, unspecified trimester: Secondary | ICD-10-CM | POA: Insufficient documentation

## 2021-07-04 DIAGNOSIS — O360120 Maternal care for anti-D [Rh] antibodies, second trimester, not applicable or unspecified: Secondary | ICD-10-CM | POA: Insufficient documentation

## 2021-07-04 DIAGNOSIS — O0990 Supervision of high risk pregnancy, unspecified, unspecified trimester: Secondary | ICD-10-CM | POA: Diagnosis present

## 2021-07-04 DIAGNOSIS — Z362 Encounter for other antenatal screening follow-up: Secondary | ICD-10-CM | POA: Insufficient documentation

## 2021-07-04 DIAGNOSIS — Z148 Genetic carrier of other disease: Secondary | ICD-10-CM | POA: Insufficient documentation

## 2021-07-04 DIAGNOSIS — Z6833 Body mass index (BMI) 33.0-33.9, adult: Secondary | ICD-10-CM

## 2021-07-04 DIAGNOSIS — Z3A32 32 weeks gestation of pregnancy: Secondary | ICD-10-CM

## 2021-07-06 ENCOUNTER — Encounter: Payer: Medicaid Other | Admitting: Family Medicine

## 2021-07-15 ENCOUNTER — Ambulatory Visit (INDEPENDENT_AMBULATORY_CARE_PROVIDER_SITE_OTHER): Payer: Medicaid Other | Admitting: Obstetrics & Gynecology

## 2021-07-15 ENCOUNTER — Other Ambulatory Visit: Payer: Self-pay

## 2021-07-15 VITALS — BP 119/66 | HR 80 | Wt 191.0 lb

## 2021-07-15 DIAGNOSIS — O26899 Other specified pregnancy related conditions, unspecified trimester: Secondary | ICD-10-CM

## 2021-07-15 DIAGNOSIS — Z6791 Unspecified blood type, Rh negative: Secondary | ICD-10-CM

## 2021-07-15 DIAGNOSIS — O24410 Gestational diabetes mellitus in pregnancy, diet controlled: Secondary | ICD-10-CM

## 2021-07-15 DIAGNOSIS — O0990 Supervision of high risk pregnancy, unspecified, unspecified trimester: Secondary | ICD-10-CM

## 2021-07-15 NOTE — Progress Notes (Signed)
   PRENATAL VISIT NOTE  Subjective:  Crystal May is a 25 y.o. G4P1021 at [redacted]w[redacted]d being seen today for ongoing prenatal care.  She is currently monitored for the following issues for this high-risk pregnancy and has Asthma affecting pregnancy, antepartum; Rh negative state in antepartum period; Alpha thalassemia trait; Obesity in pregnancy; Supervision of high risk pregnancy, antepartum; History of gestational diabetes mellitus (GDM) in prior pregnancy, currently pregnant; and Gestational diabetes on their problem list.  Patient reports no complaints.  Contractions: Not present. Vag. Bleeding: None.  Movement: Present. Denies leaking of fluid.   The following portions of the patient's history were reviewed and updated as appropriate: allergies, current medications, past family history, past medical history, past social history, past surgical history and problem list.   Objective:   Vitals:   07/15/21 0822  BP: 119/66  Pulse: 80  Weight: 191 lb (86.6 kg)    Fetal Status: Fetal Heart Rate (bpm): 137   Movement: Present     General:  Alert, oriented and cooperative. Patient is in no acute distress.  Skin: Skin is warm and dry. No rash noted.   Cardiovascular: Normal heart rate noted  Respiratory: Normal respiratory effort, no problems with respiration noted  Abdomen: Soft, gravid, appropriate for gestational age.  Pain/Pressure: Present     Pelvic: Cervical exam deferred        Extremities: Normal range of motion.  Edema: None  Mental Status: Normal mood and affect. Normal behavior. Normal judgment and thought content.   Assessment and Plan:  Pregnancy: G4P1021 at [redacted]w[redacted]d 1. Diet controlled gestational diabetes mellitus (GDM) in third trimester Occasional elevated fasting blood sugar readings in the mornings which she states occur if she eats a larger supper or late supper. Otherwise appropriately controlled. -Continue dietary control of blood glucose levels  2. Supervision of high  risk pregnancy, antepartum BP and FHR normal. She is doing well and will plan to continue routine antepartum care.  3. Rh negative state in antepartum period -Received rhogam (06/07/21)   Preterm labor symptoms and general obstetric precautions including but not limited to vaginal bleeding, contractions, leaking of fluid and fetal movement were reviewed in detail with the patient. Please refer to After Visit Summary for other counseling recommendations.   Return in about 2 weeks (around 07/29/2021).  Future Appointments  Date Time Provider Department Center  08/01/2021  9:30 AM [facility] NURSE [facility]  08/01/2021  9:45 AM [facility] US5 [facility]US [facility]    Val Eagle, Medical Student Attestation of Attending Supervision of Medical Student: Evaluation and management procedures were performed by the medical student under my supervision and collaboration.  I have reviewed the student's note and chart, and I agree with the management and plan.  Scheryl Darter, MD, FACOG Attending Obstetrician & Gynecologist Faculty Practice, [facility]

## 2021-08-01 ENCOUNTER — Encounter: Payer: Self-pay | Admitting: *Deleted

## 2021-08-01 ENCOUNTER — Other Ambulatory Visit (HOSPITAL_COMMUNITY)
Admission: RE | Admit: 2021-08-01 | Discharge: 2021-08-01 | Disposition: A | Discharge status: Home / Self Care | Payer: Medicaid Other | Source: Ambulatory Visit | Attending: Obstetrics and Gynecology | Admitting: Obstetrics and Gynecology

## 2021-08-01 ENCOUNTER — Ambulatory Visit: Payer: Medicaid Other | Admitting: *Deleted

## 2021-08-01 ENCOUNTER — Other Ambulatory Visit: Payer: Self-pay

## 2021-08-01 ENCOUNTER — Ambulatory Visit (INDEPENDENT_AMBULATORY_CARE_PROVIDER_SITE_OTHER): Payer: Medicaid Other | Admitting: Obstetrics and Gynecology

## 2021-08-01 ENCOUNTER — Ambulatory Visit: Payer: Medicaid Other | Attending: Obstetrics

## 2021-08-01 VITALS — BP 110/70 | HR 87 | Wt 195.0 lb

## 2021-08-01 VITALS — BP 124/67 | HR 79

## 2021-08-01 DIAGNOSIS — O0990 Supervision of high risk pregnancy, unspecified, unspecified trimester: Secondary | ICD-10-CM

## 2021-08-01 DIAGNOSIS — O99213 Obesity complicating pregnancy, third trimester: Secondary | ICD-10-CM

## 2021-08-01 DIAGNOSIS — O0993 Supervision of high risk pregnancy, unspecified, third trimester: Secondary | ICD-10-CM | POA: Insufficient documentation

## 2021-08-01 DIAGNOSIS — O24410 Gestational diabetes mellitus in pregnancy, diet controlled: Secondary | ICD-10-CM | POA: Diagnosis not present

## 2021-08-01 DIAGNOSIS — Z6833 Body mass index (BMI) 33.0-33.9, adult: Secondary | ICD-10-CM

## 2021-08-01 DIAGNOSIS — Z3A36 36 weeks gestation of pregnancy: Secondary | ICD-10-CM | POA: Insufficient documentation

## 2021-08-01 DIAGNOSIS — Z8632 Personal history of gestational diabetes: Secondary | ICD-10-CM

## 2021-08-01 DIAGNOSIS — E669 Obesity, unspecified: Secondary | ICD-10-CM

## 2021-08-01 DIAGNOSIS — O26899 Other specified pregnancy related conditions, unspecified trimester: Secondary | ICD-10-CM

## 2021-08-01 DIAGNOSIS — O09299 Supervision of pregnancy with other poor reproductive or obstetric history, unspecified trimester: Secondary | ICD-10-CM | POA: Insufficient documentation

## 2021-08-01 DIAGNOSIS — O99519 Diseases of the respiratory system complicating pregnancy, unspecified trimester: Secondary | ICD-10-CM

## 2021-08-01 DIAGNOSIS — J45909 Unspecified asthma, uncomplicated: Secondary | ICD-10-CM

## 2021-08-01 DIAGNOSIS — O99210 Obesity complicating pregnancy, unspecified trimester: Secondary | ICD-10-CM

## 2021-08-01 DIAGNOSIS — Z6791 Unspecified blood type, Rh negative: Secondary | ICD-10-CM

## 2021-08-01 LAB — OB RESULTS CONSOLE GC/CHLAMYDIA: Gonorrhea: NEGATIVE

## 2021-08-01 NOTE — Progress Notes (Signed)
    PRENATAL VISIT NOTE  Subjective:  Crystal May is a 25 y.o. G4P1021 at [redacted]w[redacted]d being seen today for ongoing prenatal care.  She is currently monitored for the following issues for this high-risk pregnancy and has Asthma affecting pregnancy, antepartum; Rh negative state in antepartum period; Alpha thalassemia trait; Obesity in pregnancy; Supervision of high risk pregnancy, antepartum; History of gestational diabetes mellitus (GDM) in prior pregnancy, currently pregnant; and Gestational diabetes on their problem list.  Patient reports no complaints.  Contractions: Not present. Vag. Bleeding: None.  Movement: Present. Denies leaking of fluid.   The following portions of the patient's history were reviewed and updated as appropriate: allergies, current medications, past family history, past medical history, past social history, past surgical history and problem list.   Objective:   Vitals:   08/01/21 1050  BP: 110/70  Pulse: 87  Weight: 195 lb (88.5 kg)    Fetal Status: Fetal Heart Rate (bpm): 147   Movement: Present  Presentation: Vertex  General:  Alert, oriented and cooperative. Patient is in no acute distress.  Skin: Skin is warm and dry. No rash noted.   Cardiovascular: Normal heart rate noted  Respiratory: Normal respiratory effort, no problems with respiration noted  Abdomen: Soft, gravid, appropriate for gestational age.  Pain/Pressure: Absent     Pelvic: Cervical exam deferred Dilation: 1.5 Effacement (%): 50 Station: Ballotable  Extremities: Normal range of motion.  Edema: None  Mental Status: Normal mood and affect. Normal behavior. Normal judgment and thought content.   Assessment and Plan:  Pregnancy: G4P1021 at [redacted]w[redacted]d 1. [redacted] weeks gestation of pregnancy - Culture, beta strep (group b only) - GC/Chlamydia probe amp ([facility])not at [facility]  2. Diet controlled gestational diabetes mellitus (GDM) in third trimester Normal CBG #s. Recommended she try to check more  numbers during the day from 2 to 3-4  11/21: Afi 17. Ceph, 83%, 3262gm, ac >99%. Rpt prn Set up iol nv  3. Rh negative state in antepartum period  4. Asthma affecting pregnancy, antepartum  5. Obesity in pregnancy  Preterm labor symptoms and general obstetric precautions including but not limited to vaginal bleeding, contractions, leaking of fluid and fetal movement were reviewed in detail with the patient. Please refer to After Visit Summary for other counseling recommendations.   Return in about 1 week (around 08/08/2021) for in person, low risk ob, md or app, in person or virtual.  Future Appointments  Date Time Provider Department Center  08/08/2021  9:35 AM Reva Bores, MD [facility]    Joseph Bing, MD

## 2021-08-02 LAB — GC/CHLAMYDIA PROBE AMP (~~LOC~~) NOT AT ARMC
Chlamydia: NEGATIVE
Comment: NEGATIVE
Comment: NORMAL
Neisseria Gonorrhea: NEGATIVE

## 2021-08-05 LAB — CULTURE, BETA STREP (GROUP B ONLY): Strep Gp B Culture: NEGATIVE

## 2021-08-08 ENCOUNTER — Ambulatory Visit (INDEPENDENT_AMBULATORY_CARE_PROVIDER_SITE_OTHER): Payer: Medicaid Other | Admitting: Family Medicine

## 2021-08-08 ENCOUNTER — Encounter: Payer: Self-pay | Admitting: Family Medicine

## 2021-08-08 ENCOUNTER — Other Ambulatory Visit: Payer: Self-pay

## 2021-08-08 VITALS — BP 115/76 | HR 86 | Wt 196.2 lb

## 2021-08-08 DIAGNOSIS — O26899 Other specified pregnancy related conditions, unspecified trimester: Secondary | ICD-10-CM

## 2021-08-08 DIAGNOSIS — Z3A37 37 weeks gestation of pregnancy: Secondary | ICD-10-CM

## 2021-08-08 DIAGNOSIS — O24410 Gestational diabetes mellitus in pregnancy, diet controlled: Secondary | ICD-10-CM

## 2021-08-08 DIAGNOSIS — Z6791 Unspecified blood type, Rh negative: Secondary | ICD-10-CM

## 2021-08-08 DIAGNOSIS — O0990 Supervision of high risk pregnancy, unspecified, unspecified trimester: Secondary | ICD-10-CM

## 2021-08-08 NOTE — Progress Notes (Signed)
Pt forgot Glucose Log today

## 2021-08-08 NOTE — Progress Notes (Signed)
   PRENATAL VISIT NOTE  Subjective:  Crystal May is a 25 y.o. G4P1021 at [redacted]w[redacted]d being seen today for ongoing prenatal care.  She is currently monitored for the following issues for this high-risk pregnancy and has Asthma affecting pregnancy, antepartum; Rh negative state in antepartum period; Alpha thalassemia trait; Obesity in pregnancy; Supervision of high risk pregnancy, antepartum; History of gestational diabetes mellitus (GDM) in prior pregnancy, currently pregnant; and Gestational diabetes on their problem list.  Patient reports no complaints.  Contractions: Not present. Vag. Bleeding: None.  Movement: Present. Denies leaking of fluid.   The following portions of the patient's history were reviewed and updated as appropriate: allergies, current medications, past family history, past medical history, past social history, past surgical history and problem list.   Objective:   Vitals:   08/08/21 0943  BP: 115/76  Pulse: 86  Weight: 196 lb 3.2 oz (89 kg)    Fetal Status: Fetal Heart Rate (bpm): 141   Movement: Present     General:  Alert, oriented and cooperative. Patient is in no acute distress.  Skin: Skin is warm and dry. No rash noted.   Cardiovascular: Normal heart rate noted  Respiratory: Normal respiratory effort, no problems with respiration noted  Abdomen: Soft, gravid, appropriate for gestational age.  Pain/Pressure: Absent     Pelvic: Cervical exam deferred        Extremities: Normal range of motion.  Edema: None  Mental Status: Normal mood and affect. Normal behavior. Normal judgment and thought content.   Assessment and Plan:  Pregnancy: G4P1021 at [redacted]w[redacted]d 1. Diet controlled gestational diabetes mellitus (GDM) in third trimester No log today, reports FBS in the 90s  2 hour after meals all < 120 Will book for IOL at 39 weeks Last growth at 3262 gms, 08/01/2021  2. Rh negative state in antepartum period S/p Rhogam, repeat post delivery prn   3. Supervision of  high risk pregnancy, antepartum GBS negative  Term labor symptoms and general obstetric precautions including but not limited to vaginal bleeding, contractions, leaking of fluid and fetal movement were reviewed in detail with the patient. Please refer to After Visit Summary for other counseling recommendations.   Return in 1 week (on 08/15/2021) for [facility].  Future Appointments  Date Time Provider Department Center  08/15/2021  8:15 AM Adam Phenix, MD [facility]  08/19/2021 12:00 AM [facility]-LD SCHED ROOM [facility]-INDC None    Reva Bores, MD

## 2021-08-12 ENCOUNTER — Other Ambulatory Visit: Payer: Self-pay | Admitting: Advanced Practice Midwife

## 2021-08-15 ENCOUNTER — Ambulatory Visit (INDEPENDENT_AMBULATORY_CARE_PROVIDER_SITE_OTHER): Payer: Medicaid Other | Admitting: Obstetrics & Gynecology

## 2021-08-15 ENCOUNTER — Other Ambulatory Visit: Payer: Self-pay

## 2021-08-15 VITALS — BP 129/84 | HR 87 | Wt 197.5 lb

## 2021-08-15 DIAGNOSIS — O0990 Supervision of high risk pregnancy, unspecified, unspecified trimester: Secondary | ICD-10-CM

## 2021-08-15 DIAGNOSIS — O99210 Obesity complicating pregnancy, unspecified trimester: Secondary | ICD-10-CM

## 2021-08-15 DIAGNOSIS — O24410 Gestational diabetes mellitus in pregnancy, diet controlled: Secondary | ICD-10-CM

## 2021-08-15 NOTE — Progress Notes (Signed)
   PRENATAL VISIT NOTE  Subjective:  Crystal May is a 25 y.o. G4P1021 at [redacted]w[redacted]d being seen today for ongoing prenatal care.  She is currently monitored for the following issues for this high-risk pregnancy and has Asthma affecting pregnancy, antepartum; Rh negative state in antepartum period; Alpha thalassemia trait; Obesity in pregnancy; Supervision of high risk pregnancy, antepartum; History of gestational diabetes mellitus (GDM) in prior pregnancy, currently pregnant; and Gestational diabetes on their problem list.  Patient reports occasional contractions.  Contractions: Irregular. Vag. Bleeding: None.  Movement: Present. Denies leaking of fluid.   The following portions of the patient's history were reviewed and updated as appropriate: allergies, current medications, past family history, past medical history, past social history, past surgical history and problem list.   Objective:   Vitals:   08/15/21 0829  BP: 129/84  Pulse: 87  Weight: 197 lb 8 oz (89.6 kg)    Fetal Status: Fetal Heart Rate (bpm): 146   Movement: Present  Presentation: Vertex  General:  Alert, oriented and cooperative. Patient is in no acute distress.  Skin: Skin is warm and dry. No rash noted.   Cardiovascular: Normal heart rate noted  Respiratory: Normal respiratory effort, no problems with respiration noted  Abdomen: Soft, gravid, appropriate for gestational age.  Pain/Pressure: Present     Pelvic: Cervical exam performed in the presence of a chaperone Dilation: 2 Effacement (%): 50 Station: Ballotable  Extremities: Normal range of motion.  Edema: None  Mental Status: Normal mood and affect. Normal behavior. Normal judgment and thought content.   Assessment and Plan:  Pregnancy: G4P1021 at [redacted]w[redacted]d 1. Supervision of high risk pregnancy, antepartum IOL 39 week  2. Diet controlled gestational diabetes mellitus (GDM) in third trimester Occasional out of range FBS and PP diet control  3. Obesity in  pregnancy Body mass index is 36.12 kg/m.   Term labor symptoms and general obstetric precautions including but not limited to vaginal bleeding, contractions, leaking of fluid and fetal movement were reviewed in detail with the patient. Please refer to After Visit Summary for other counseling recommendations.   Return if symptoms worsen or fail to improve, for postpartum.  Future Appointments  Date Time Provider Department Center  08/19/2021 12:00 AM [facility] SCHED ROOM [facility] None    Scheryl Darter, MD

## 2021-08-19 ENCOUNTER — Inpatient Hospital Stay (HOSPITAL_COMMUNITY): Payer: Medicaid Other | Admitting: Anesthesiology

## 2021-08-19 ENCOUNTER — Inpatient Hospital Stay (HOSPITAL_COMMUNITY): Payer: Medicaid Other

## 2021-08-19 ENCOUNTER — Inpatient Hospital Stay (HOSPITAL_COMMUNITY)
Admission: AD | Admit: 2021-08-19 | Discharge: 2021-08-20 | DRG: 807 | Disposition: A | Discharge status: Home / Self Care | Payer: Medicaid Other | Attending: Family Medicine | Admitting: Family Medicine

## 2021-08-19 ENCOUNTER — Encounter (HOSPITAL_COMMUNITY): Payer: Self-pay | Admitting: Family Medicine

## 2021-08-19 ENCOUNTER — Other Ambulatory Visit: Payer: Self-pay

## 2021-08-19 DIAGNOSIS — O24420 Gestational diabetes mellitus in childbirth, diet controlled: Principal | ICD-10-CM | POA: Diagnosis present

## 2021-08-19 DIAGNOSIS — O0990 Supervision of high risk pregnancy, unspecified, unspecified trimester: Secondary | ICD-10-CM

## 2021-08-19 DIAGNOSIS — O99519 Diseases of the respiratory system complicating pregnancy, unspecified trimester: Secondary | ICD-10-CM | POA: Diagnosis present

## 2021-08-19 DIAGNOSIS — O24410 Gestational diabetes mellitus in pregnancy, diet controlled: Secondary | ICD-10-CM | POA: Diagnosis present

## 2021-08-19 DIAGNOSIS — Z6791 Unspecified blood type, Rh negative: Secondary | ICD-10-CM

## 2021-08-19 DIAGNOSIS — O9952 Diseases of the respiratory system complicating childbirth: Secondary | ICD-10-CM | POA: Diagnosis present

## 2021-08-19 DIAGNOSIS — O26899 Other specified pregnancy related conditions, unspecified trimester: Secondary | ICD-10-CM

## 2021-08-19 DIAGNOSIS — Z20822 Contact with and (suspected) exposure to covid-19: Secondary | ICD-10-CM | POA: Diagnosis present

## 2021-08-19 DIAGNOSIS — O26893 Other specified pregnancy related conditions, third trimester: Secondary | ICD-10-CM | POA: Diagnosis present

## 2021-08-19 DIAGNOSIS — J45909 Unspecified asthma, uncomplicated: Secondary | ICD-10-CM | POA: Diagnosis present

## 2021-08-19 DIAGNOSIS — O99214 Obesity complicating childbirth: Secondary | ICD-10-CM | POA: Diagnosis present

## 2021-08-19 DIAGNOSIS — D563 Thalassemia minor: Secondary | ICD-10-CM | POA: Diagnosis present

## 2021-08-19 DIAGNOSIS — O99210 Obesity complicating pregnancy, unspecified trimester: Secondary | ICD-10-CM | POA: Diagnosis present

## 2021-08-19 DIAGNOSIS — Z3A39 39 weeks gestation of pregnancy: Secondary | ICD-10-CM

## 2021-08-19 LAB — GLUCOSE, CAPILLARY
Glucose-Capillary: 97 mg/dL (ref 70–99)
Glucose-Capillary: 108 mg/dL — ABNORMAL HIGH (ref 70–99)
Glucose-Capillary: 98 mg/dL (ref 70–99)
Glucose-Capillary: 95 mg/dL (ref 70–99)

## 2021-08-19 LAB — RESP PANEL BY RT-PCR (FLU A&B, COVID) ARPGX2
Influenza A by PCR: NEGATIVE
Influenza B by PCR: NEGATIVE
SARS Coronavirus 2 by RT PCR: NEGATIVE

## 2021-08-19 LAB — CBC
HCT: 36.5 % (ref 36.0–46.0)
Hemoglobin: 12 g/dL (ref 12.0–15.0)
MCH: 25.2 pg — ABNORMAL LOW (ref 26.0–34.0)
MCHC: 32.9 g/dL (ref 30.0–36.0)
MCV: 76.5 fL — ABNORMAL LOW (ref 80.0–100.0)
Platelets: 207 10*3/uL (ref 150–400)
RBC: 4.77 MIL/uL (ref 3.87–5.11)
RDW: 15.1 % (ref 11.5–15.5)
WBC: 8.6 10*3/uL (ref 4.0–10.5)
nRBC: 0 % (ref 0.0–0.2)

## 2021-08-19 LAB — RPR: RPR Ser Ql: NONREACTIVE

## 2021-08-19 LAB — TYPE AND SCREEN
ABO/RH(D): O NEG
Antibody Screen: POSITIVE

## 2021-08-19 MED ORDER — EPHEDRINE 5 MG/ML INJ: 10.0000 mg | INTRAVENOUS | Status: DC | PRN

## 2021-08-19 MED ORDER — FENTANYL-BUPIVACAINE-NACL 0.5-0.125-0.9 MG/250ML-% EP SOLN: 12.0000 mL/h | EPIDURAL | Status: DC | PRN

## 2021-08-19 MED ORDER — OXYTOCIN-SODIUM CHLORIDE 30-0.9 UT/500ML-% IV SOLN
2.5000 [IU]/h | INTRAVENOUS | Status: DC
  Filled 2021-08-19: qty 500

## 2021-08-19 MED ORDER — LACTATED RINGERS IV SOLN
500.0000 mL | Freq: Once | INTRAVENOUS | Status: AC
  Administered 2021-08-19: 500 mL via INTRAVENOUS

## 2021-08-19 MED ORDER — FENTANYL-BUPIVACAINE-NACL 0.5-0.125-0.9 MG/250ML-% EP SOLN
EPIDURAL | Status: AC
  Filled 2021-08-19: qty 250

## 2021-08-19 MED ORDER — ACETAMINOPHEN 325 MG PO TABS: 650.0000 mg | ORAL_TABLET | ORAL | Status: DC | PRN

## 2021-08-19 MED ORDER — SENNOSIDES-DOCUSATE SODIUM 8.6-50 MG PO TABS
2.0000 | ORAL_TABLET | Freq: Every day | ORAL | Status: DC
  Administered 2021-08-20: 2 via ORAL
  Filled 2021-08-19: qty 2

## 2021-08-19 MED ORDER — OXYTOCIN BOLUS FROM INFUSION
333.0000 mL | Freq: Once | INTRAVENOUS | Status: DC
  Administered 2021-08-19: 333 mL via INTRAVENOUS

## 2021-08-19 MED ORDER — MISOPROSTOL 50MCG HALF TABLET: 50.0000 ug | ORAL_TABLET | ORAL | Status: DC | PRN

## 2021-08-19 MED ORDER — RHO D IMMUNE GLOBULIN 1500 UNIT/2ML IJ SOSY
300.0000 ug | PREFILLED_SYRINGE | Freq: Once | INTRAMUSCULAR | Status: AC
  Administered 2021-08-19: 300 ug via INTRAVENOUS
  Filled 2021-08-19: qty 2

## 2021-08-19 MED ORDER — PRENATAL MULTIVITAMIN CH: 1.0000 | ORAL_TABLET | Freq: Every day | ORAL | Status: DC

## 2021-08-19 MED ORDER — OXYCODONE-ACETAMINOPHEN 5-325 MG PO TABS: 1.0000 | ORAL_TABLET | ORAL | Status: DC | PRN

## 2021-08-19 MED ORDER — IBUPROFEN 600 MG PO TABS
600.0000 mg | ORAL_TABLET | Freq: Four times a day (QID) | ORAL | Status: DC
  Administered 2021-08-19 – 2021-08-20 (×3): 600 mg via ORAL
  Filled 2021-08-19 (×3): qty 1

## 2021-08-19 MED ORDER — ONDANSETRON HCL 4 MG PO TABS: 4.0000 mg | ORAL_TABLET | ORAL | Status: DC | PRN

## 2021-08-19 MED ORDER — FENTANYL CITRATE (PF) 100 MCG/2ML IJ SOLN
100.0000 ug | INTRAMUSCULAR | Status: DC | PRN
  Administered 2021-08-19 (×2): 100 ug via INTRAVENOUS
  Filled 2021-08-19 (×2): qty 2

## 2021-08-19 MED ORDER — LIDOCAINE HCL (PF) 1 % IJ SOLN
INTRAMUSCULAR | Status: DC | PRN
  Administered 2021-08-19: 10 mL via EPIDURAL
  Administered 2021-08-19: 2 mL via EPIDURAL

## 2021-08-19 MED ORDER — DIPHENHYDRAMINE HCL 50 MG/ML IJ SOLN: 12.5000 mg | INTRAMUSCULAR | Status: DC | PRN

## 2021-08-19 MED ORDER — DIPHENHYDRAMINE HCL 25 MG PO CAPS: 25.0000 mg | ORAL_CAPSULE | Freq: Four times a day (QID) | ORAL | Status: DC | PRN

## 2021-08-19 MED ORDER — PHENYLEPHRINE 40 MCG/ML (10ML) SYRINGE FOR IV PUSH (FOR BLOOD PRESSURE SUPPORT)
80.0000 ug | PREFILLED_SYRINGE | INTRAVENOUS | Status: DC | PRN
  Filled 2021-08-19: qty 10

## 2021-08-19 MED ORDER — OXYCODONE-ACETAMINOPHEN 5-325 MG PO TABS: 2.0000 | ORAL_TABLET | ORAL | Status: DC | PRN

## 2021-08-19 MED ORDER — COCONUT OIL OIL: 1.0000 "application " | TOPICAL_OIL | Status: DC | PRN

## 2021-08-19 MED ORDER — DIBUCAINE (PERIANAL) 1 % EX OINT: 1.0000 "application " | TOPICAL_OINTMENT | CUTANEOUS | Status: DC | PRN

## 2021-08-19 MED ORDER — FENTANYL-BUPIVACAINE-NACL 0.5-0.125-0.9 MG/250ML-% EP SOLN
EPIDURAL | Status: DC | PRN
  Administered 2021-08-19: 12 mL/h via EPIDURAL

## 2021-08-19 MED ORDER — OXYTOCIN-SODIUM CHLORIDE 30-0.9 UT/500ML-% IV SOLN
1.0000 m[IU]/min | INTRAVENOUS | Status: DC
  Administered 2021-08-19: 2 m[IU]/min via INTRAVENOUS

## 2021-08-19 MED ORDER — TETANUS-DIPHTH-ACELL PERTUSSIS 5-2.5-18.5 LF-MCG/0.5 IM SUSY: 0.5000 mL | PREFILLED_SYRINGE | Freq: Once | INTRAMUSCULAR | Status: DC

## 2021-08-19 MED ORDER — TERBUTALINE SULFATE 1 MG/ML IJ SOLN
0.2500 mg | Freq: Once | INTRAMUSCULAR | Status: AC | PRN
  Administered 2021-08-19: 0.25 mg via SUBCUTANEOUS
  Filled 2021-08-19: qty 1

## 2021-08-19 MED ORDER — MISOPROSTOL 25 MCG QUARTER TABLET
25.0000 ug | ORAL_TABLET | ORAL | Status: DC | PRN
  Administered 2021-08-19: 25 ug via VAGINAL
  Filled 2021-08-19: qty 1

## 2021-08-19 MED ORDER — WITCH HAZEL-GLYCERIN EX PADS: 1.0000 "application " | MEDICATED_PAD | CUTANEOUS | Status: DC | PRN

## 2021-08-19 MED ORDER — LIDOCAINE HCL (PF) 1 % IJ SOLN: 30.0000 mL | INTRAMUSCULAR | Status: DC | PRN

## 2021-08-19 MED ORDER — SIMETHICONE 80 MG PO CHEW: 80.0000 mg | CHEWABLE_TABLET | ORAL | Status: DC | PRN

## 2021-08-19 MED ORDER — OXYCODONE HCL 5 MG PO TABS
5.0000 mg | ORAL_TABLET | ORAL | Status: DC | PRN
  Administered 2021-08-20: 5 mg via ORAL
  Filled 2021-08-19: qty 1

## 2021-08-19 MED ORDER — SOD CITRATE-CITRIC ACID 500-334 MG/5ML PO SOLN: 30.0000 mL | ORAL | Status: DC | PRN

## 2021-08-19 MED ORDER — LACTATED RINGERS IV SOLN
500.0000 mL | INTRAVENOUS | Status: DC | PRN
  Administered 2021-08-19: 500 mL via INTRAVENOUS

## 2021-08-19 MED ORDER — ONDANSETRON HCL 4 MG/2ML IJ SOLN: 4.0000 mg | INTRAMUSCULAR | Status: DC | PRN

## 2021-08-19 MED ORDER — FLEET ENEMA 7-19 GM/118ML RE ENEM: 1.0000 | ENEMA | RECTAL | Status: DC | PRN

## 2021-08-19 MED ORDER — BENZOCAINE-MENTHOL 20-0.5 % EX AERO
1.0000 "application " | INHALATION_SPRAY | CUTANEOUS | Status: DC | PRN
  Filled 2021-08-19: qty 56

## 2021-08-19 MED ORDER — PHENYLEPHRINE 40 MCG/ML (10ML) SYRINGE FOR IV PUSH (FOR BLOOD PRESSURE SUPPORT)
80.0000 ug | PREFILLED_SYRINGE | INTRAVENOUS | Status: DC | PRN
  Administered 2021-08-19 (×2): 80 ug via INTRAVENOUS

## 2021-08-19 MED ORDER — ONDANSETRON HCL 4 MG/2ML IJ SOLN
4.0000 mg | Freq: Four times a day (QID) | INTRAMUSCULAR | Status: DC | PRN
  Administered 2021-08-19: 4 mg via INTRAVENOUS
  Filled 2021-08-19: qty 2

## 2021-08-19 MED ORDER — LACTATED RINGERS IV SOLN: INTRAVENOUS | Status: DC

## 2021-08-19 NOTE — Progress Notes (Signed)
Labor Progress Note   Noted several late decelerations, patient had just been placed into her right lateral side. Otherwise with reassuring moderate variability. During this, she was also contracting about every 2 minutes. Pit at 2. Possible recent ROM with gush of fluid around 1030 (however suspected ROM prior to FSE placement around 0830). Cervix last checked 1 hour ago and around 5cm.   Placed onto left lateral side. Discontinued pit. LR bolus running. Decelerations improving with mod var still, will continue to monitor. If persistent despite above, will recheck cervix with likely IUPC placement, consider terb if still tachysystole.   Allayne Stack, DO

## 2021-08-19 NOTE — Progress Notes (Signed)
Labor Progress Note Crystal May is a 25 y.o. C5Y8502 at [redacted]w[redacted]d presented for IOL d/t A1GDM.  S: Feeling pressure in her bottom.   O:  BP 116/71   Pulse 96   Temp 98.3 F (36.8 C) (Oral)   Resp 16   Ht 5\' 2"  (1.575 m)   Wt 90.2 kg   LMP 11/19/2020 (Exact Date)   SpO2 99%   BMI 36.38 kg/m  EFM: 145/mod/15x15/occasional early vs variable   CVE: Dilation: 9.5 Effacement (%): 100 Cervical Position: Posterior Station: 0 Presentation: Vertex Exam by:: S [place] Steidle Positive scalp stim with check    A&P: 25 y.o. 30 [redacted]w[redacted]d  #Labor: Progressing well, almost complete and at 0 station. Placed into right lateral with a peanut. Will monitor closely, anticipate VD shortly.  #Pain: Epidural in place #FWB: Currently Cat I, earlier episode resolved  #GBS negative  #A1GDM: Fetal EFW 83% with AC >99% at 36 weeks. Will plan for dystocia.   [redacted]w[redacted]d, DO 2:18 PM

## 2021-08-19 NOTE — Anesthesia Procedure Notes (Signed)
Epidural Patient location during procedure: OB Start time: 08/19/2021 7:43 AM End time: 08/19/2021 7:50 AM  Staffing Anesthesiologist: Lannie Fields, DO Performed: anesthesiologist   Preanesthetic Checklist Completed: patient identified, IV checked, risks and benefits discussed, monitors and equipment checked, pre-op evaluation and timeout performed  Epidural Patient position: sitting Prep: DuraPrep and site prepped and draped Patient monitoring: continuous pulse ox, blood pressure, heart rate and cardiac monitor Approach: midline Location: L3-L4 Injection technique: LOR air  Needle:  Needle type: Tuohy  Needle gauge: 17 G Needle length: 9 cm Needle insertion depth: 8 cm Catheter type: closed end flexible Catheter size: 19 Gauge Catheter at skin depth: 13 cm Test dose: negative  Assessment Sensory level: T8 Events: blood not aspirated, injection not painful, no injection resistance, no paresthesia and negative IV test  Additional Notes Patient identified. Risks/Benefits/Options discussed with patient including but not limited to bleeding, infection, nerve damage, paralysis, failed block, incomplete pain control, headache, blood pressure changes, nausea, vomiting, reactions to medication both or allergic, itching and postpartum back pain. Confirmed with bedside nurse the patient's most recent platelet count. Confirmed with patient that they are not currently taking any anticoagulation, have any bleeding history or any family history of bleeding disorders. Patient expressed understanding and wished to proceed. All questions were answered. Sterile technique was used throughout the entire procedure. Please see nursing notes for vital signs. Test dose was given through epidural catheter and negative prior to continuing to dose epidural or start infusion. Warning signs of high block given to the patient including shortness of breath, tingling/numbness in hands, complete motor  block, or any concerning symptoms with instructions to call for help. Patient was given instructions on fall risk and not to get out of bed. All questions and concerns addressed with instructions to call with any issues or inadequate analgesia.  Reason for block:procedure for pain

## 2021-08-19 NOTE — H&P (Signed)
Obstetric History and Physical  Tyreonna Gudiel is a 25 y.o. R4B6384 with IUP at 4w0dpresenting for induction of labor due to A1GDM. Patient states she has been having  irregular contractions,  no  vaginal bleeding, intact membranes, with active fetal movement.    Last growth scan:   11/21 330w3dFW 3262g/83%, AC >99%   Prenatal Course Source of Care: [facility]  with onset of care at 14 weeks Pregnancy complications or risks: Patient Active Problem List   Diagnosis Date Noted   Gestational diabetes mellitus, class A1 05/24/2021   Supervision of high risk pregnancy, antepartum 02/09/2021   Obesity in pregnancy 04/30/2019   Alpha thalassemia trait 01/09/2019   Rh negative state in antepartum period 12/31/2018   Asthma affecting pregnancy, antepartum 12/05/2018   Office Location  [facility]  Dating  LMP  Language   English Anatomy US[place]  Flu Vaccine   05-23-21 Genetic/Carrier Screen  NIPS:  Low risk female  AFP:   neg Horizon:Silent Alpha Thal Carrier  TDaP Vaccine   05-23-21 Hgb A1C or  GTT Early 5.3 Third trimester   COVID Vaccine  NO   LAB RESULTS   Rhogam  06/07/2021 Blood Type O/Negative/-- (06/20 1614)   Baby Feeding Plan  Breast /bottle Antibody Negative (09/12 0859)  Contraception Undecided Rubella 1.50 (06/20 1614)  Circumcision Yes RPR Non Reactive (09/12 0859)   [facility] HBsAg Negative (06/20 1614)   Support Person  FOB HCVAb Negative  Prenatal Classes No HIV Non Reactive (09/12 0859)     BTL Consent NA GBS Negative/-- (11/21 1100)  VBAC Consent NA Pap 02/28/2021 - Normal     Past Medical History:  Diagnosis Date   Anemia in pregnancy 04/30/2019   Asthma    Gestational diabetes    Seasonal allergies     Past Surgical History:  Procedure Laterality Date   KNEE SURGERY      OB History  Gravida Para Term Preterm AB Living  _0 SAB IAB Ectopic Multiple Live Births  2     0 1    # Outcome Date GA Lbr Len/2nd Weight Sex  Delivery Anes PTL Lv  4 Current           3 SAB 05/2020 1013w0d      2 Term 06/19/19 39w18w1d03 / 06:17 3340 g F Vag-Spont EPI  LIV     Birth Comments: GDM  1 SAB 05/04/18 65w0d62w0d      Social History   Socioeconomic History   Marital status: Single    Spouse name: Not on file   Number of children: Not on file   Years of education: Not on file   Highest education level: Not on file  Occupational History   Occupation: Host    Employer: [employer]  Tobacco Use   Smoking status: Never    Passive exposure: Yes   Smokeless tobacco: Never  Vaping Use   Vaping Use: Never used  Substance and Sexual Activity   Alcohol use: Not Currently    Comment: occasionally   Drug use: No   Sexual activity: Yes    Birth control/protection: None  Other Topics Concern   Not on file  Social History Narrative   Not on file   Social Determinants of Health   Financial Resource Strain: Not on file  Food Insecurity: No Food Insecurity  Worried About Charity fundraiser in the Last Year: Never true   [place] in the Last Year: Never true  Transportation Needs: No Transportation Needs   Lack of Transportation (Medical): No   Lack of Transportation (Non-Medical): No  Physical Activity: Not on file  Stress: Not on file  Social Connections: Not on file    Family History  Problem Relation Age of Onset   Healthy Mother    Healthy Father     Medications Prior to Admission  Medication Sig Dispense Refill Last Dose   Accu-Chek Softclix Lancets lancets Use as instructed 100 each 12 08/18/2021   Blood Glucose Monitoring Suppl (ACCU-CHEK GUIDE) w/Device KIT 1 Device by Does not apply route in the morning, at noon, in the evening, and at bedtime. 1 kit 0 08/18/2021   Blood Pressure Monitoring (BLOOD PRESSURE KIT) DEVI 1 Device by Does not apply route as needed. 1 each 0 08/18/2021   glucose blood test strip Use as instructed 100 each 12 08/18/2021   Prenatal Vit-Fe Fumarate-FA  (PREPLUS) 27-1 MG TABS Take 1 tablet by mouth daily. 30 tablet 13 Past Month    No Known Allergies  Review of Systems: Negative except for what is mentioned in HPI.  Physical Exam: BP 111/69   Pulse (!) 104   Temp 98.3 F (36.8 C) (Oral)   Resp 16   Ht _0  (1.575 m)   Wt 90.2 kg   LMP 11/19/2020 (Exact Date)   BMI 36.38 kg/m  CONSTITUTIONAL: Well-developed, well-nourished female in no acute distress.  HENT:  Normocephalic, atraumatic, External right and left ear normal.  EYES: Conjunctivae and EOM are normal. Pupils are equal, round, and reactive to light. No scleral icterus.  NECK: Normal range of motion, supple, no masses SKIN: Skin is warm and dry. No rash noted. Not diaphoretic. No erythema. No pallor. NEUROLOGIC: Alert and oriented to person, place, and time. Normal reflexes, muscle tone coordination. No cranial nerve deficit noted. PSYCHIATRIC: Normal mood and affect. Normal behavior. Normal judgment and thought content. CARDIOVASCULAR: Normal heart rate noted, regular rhythm RESPIRATORY: Effort and breath sounds normal, no problems with respiration noted ABDOMEN: Soft, nontender, nondistended, gravid. MUSCULOSKELETAL: Normal range of motion. No edema and no tenderness. 2+ distal pulses.  FHT:  Baseline rate 120 bpm   Variability moderate  Accelerations present   Decelerations none Contractions:None on tocometer  Cervical Exam: Dilation: 2 Effacement (%): 60 Cervical Position: Posterior Station: Ballotable Presentation: Vertex (by BSUS) Exam by:: Dr. Harolyn Rutherford Foley cathether placed with 60 ml of fluid Cytotec 25 mcg pv placed.    Pertinent Labs/Studies:   Results for orders placed or performed during the hospital encounter of 08/19/21 (from the past 24 hour(s))  Type and screen     Status: None (Preliminary result)   Collection Time: 08/19/21  1:10 AM  Result Value Ref Range   ABO/RH(D) PENDING    Antibody Screen PENDING    Sample Expiration       08/22/2021,2359 Performed at [facility], [place] [address], [place], [place] [address]   CBC     Status: Abnormal   Collection Time: 08/19/21  1:17 AM  Result Value Ref Range   WBC 8.6 4.0 - 10.5 K/uL   RBC 4.77 3.87 - 5.11 MIL/uL   Hemoglobin 12.0 12.0 - 15.0 g/dL   HCT 36.5 36.0 - 46.0 %   MCV 76.5 (L) 80.0 - 100.0 fL   MCH 25.2 (L) 26.0 - 34.0 pg  MCHC 32.9 30.0 - 36.0 g/dL   RDW 15.1 11.5 - 15.5 %   Platelets 207 150 - 400 K/uL   nRBC 0.0 0.0 - 0.2 %    Assessment : Luciann Gossett is a 25 y.o. O2P1898 at 82w0dbeing admitted for induction of labor due to A1GDM.  Plan: Labor: Patient is s/p foley bulb placement and Cytotec #1 around 0200. Will continue induction as per protocol. Analgesia as needed. A1GDM: CBGs q4h for now, then q2h in active labor. Treat if >120. FWB: Reassuring fetal heart tracing.  GBS negative Delivery plan: Hopeful for vaginal delivery   UVerita Schneiders MD, FMount [facility] [place]

## 2021-08-19 NOTE — Progress Notes (Signed)
Patient ID: Crystal May, female   DOB: [birth year], 25 y.o.   MRN: [id number]  Labor Progress Note [name] is a 25 y.o. G4P1021 at [redacted]w[redacted]d presented for IOL for A1GDM with EFW of 83%ile at 36wks  Per RN, patient is resting comfortably with family at bedside. Contractions have spaced considerably since earlier prolonged deceleration and tracing has remained reactive. Verbal order given to start Pitocin titration but titrate slowly. Will continue to monitor fetal tolerance to contractions closely.  Edd Arbour, CNM, MSN, IBCLC Certified Nurse Midwife, [facility]

## 2021-08-19 NOTE — Anesthesia Preprocedure Evaluation (Signed)
Anesthesia Evaluation  Patient identified by MRN, date of birth, ID band Patient awake    Reviewed: Allergy & Precautions, Patient's Chart, lab work & pertinent test results  Airway Mallampati: II  TM Distance: >3 FB Neck ROM: Full    Dental no notable dental hx.    Pulmonary asthma ,    Pulmonary exam normal breath sounds clear to auscultation       Cardiovascular negative cardio ROS Normal cardiovascular exam Rhythm:Regular Rate:Normal     Neuro/Psych negative neurological ROS  negative psych ROS   GI/Hepatic negative GI ROS, Neg liver ROS,   Endo/Other  diabetes, Well Controlled, GestationalObesity BMI 36  Renal/GU negative Renal ROS  negative genitourinary   Musculoskeletal negative musculoskeletal ROS (+)   Abdominal   Peds  Hematology negative hematology ROS (+) hct 36.5, plt 207   Anesthesia Other Findings   Reproductive/Obstetrics (+) Pregnancy                             Anesthesia Physical Anesthesia Plan  ASA: 2  Anesthesia Plan: Epidural   Post-op Pain Management:    Induction:   PONV Risk Score and Plan: 2  Airway Management Planned: Natural Airway  Additional Equipment: None  Intra-op Plan:   Post-operative Plan:   Informed Consent: I have reviewed the patients History and Physical, chart, labs and discussed the procedure including the risks, benefits and alternatives for the proposed anesthesia with the patient or authorized representative who has indicated his/her understanding and acceptance.       Plan Discussed with:   Anesthesia Plan Comments:         Anesthesia Quick Evaluation

## 2021-08-19 NOTE — Discharge Summary (Signed)
Postpartum Discharge Summary     Patient Name: Crystal May DOB: 04-06-1996 MRN: [id number]  Date of admission: 08/19/2021 Delivery date:08/19/2021  Delivering provider: Patriciaann Clan  Date of discharge: 08/20/2021  Admitting diagnosis: GDM, class A1 [O24.410] Intrauterine pregnancy: [redacted]w[redacted]d    Secondary diagnosis:  Principal Problem:   Gestational diabetes mellitus, class A1 Active Problems:   Asthma affecting pregnancy, antepartum   Rh negative state in antepartum period   Alpha thalassemia trait   Obesity in pregnancy   Supervision of high risk pregnancy, antepartum  Additional problems: N/A    Discharge diagnosis: Term Pregnancy Delivered and GDM A1                                              Post partum procedures:rhogam Augmentation: Pitocin, Cytotec, and IP Foley Complications: None  Hospital course: Induction of Labor With Vaginal Delivery   25y.o. yo [id number] 328w0das admitted to the hospital 08/19/2021 for induction of labor.  Indication for induction: A1 DM.  Patient had an uncomplicated labor course as follows: Membrane Rupture Time/Date: 8:19 AM ,08/19/2021   Delivery Method:Vaginal, Spontaneous  Episiotomy: None  Lacerations:    Details of delivery can be found in separate delivery note.  Patient had a routine postpartum course. Patient is discharged home 08/19/21.  Newborn Data: Birth date:08/19/2021  Birth time:3:22 PM  Gender:Female  Living status:Living  Apgars:9 ,9  Weight:   Magnesium Sulfate received: No BMZ received: No Rhophylac:No MMR:No T-DaP:Given prenatally Flu: No Transfusion:No  Physical exam  Vitals:   08/19/21 1501 08/19/21 1530 08/19/21 1545 08/19/21 1600  BP: 127/86 130/70 122/68 116/68  Pulse: 96 98 100 98  Resp: 18 18 18 18   Temp:      TempSrc:      SpO2:      Weight:      Height:       General: alert, cooperative, and no distress Lochia: appropriate Uterine Fundus: firm Incision: N/A DVT Evaluation: No  evidence of DVT seen on physical exam. Labs: Lab Results  Component Value Date   WBC 8.6 08/19/2021   HGB 12.0 08/19/2021   HCT 36.5 08/19/2021   MCV 76.5 (L) 08/19/2021   PLT 207 08/19/2021   CMP Latest Ref Rng & Units 05/24/2020  Glucose 70 - 99 mg/dL 87  BUN 6 - 20 mg/dL 9  Creatinine 0.44 - 1.00 mg/dL 0.57  Sodium 135 - 145 mmol/L 134(L)  Potassium 3.5 - 5.1 mmol/L 4.2  Chloride 98 - 111 mmol/L 104  CO2 22 - 32 mmol/L 23  Calcium 8.9 - 10.3 mg/dL 9.2  Total Protein 6.5 - 8.1 g/dL 6.7  Total Bilirubin 0.3 - 1.2 mg/dL 0.5  Alkaline Phos 38 - 126 U/L 50  AST 15 - 41 U/L 18  ALT 0 - 44 U/L 21   Edinburgh Score: Edinburgh Postnatal Depression Scale Screening Tool 07/31/2019  I have been able to laugh and see the funny side of things. 0  I have looked forward with enjoyment to things. 0  I have blamed myself unnecessarily when things went wrong. 0  I have been anxious or worried for no good reason. 0  I have felt scared or panicky for no good reason. 0  Things have been getting on top of me. 0  I have been so unhappy that I have  had difficulty sleeping. 0  I have felt sad or miserable. 0  I have been so unhappy that I have been crying. 0  The thought of harming myself has occurred to me. 0  Edinburgh Postnatal Depression Scale Total 0     After visit meds:  Allergies as of 08/20/2021   No Known Allergies      Medication List     STOP taking these medications    Accu-Chek Guide w/Device Kit   Accu-Chek Softclix Lancets lancets   glucose blood test strip       TAKE these medications    acetaminophen 325 MG tablet Commonly known as: Tylenol Take 2 tablets (650 mg total) by mouth every 4 (four) hours as needed for up to 180 doses (for pain scale < 4).   Blood Pressure Kit Devi 1 Device by Does not apply route as needed.   ibuprofen 600 MG tablet Commonly known as: ADVIL Take 1 tablet (600 mg total) by mouth every 6 (six) hours.   norethindrone 0.35  MG tablet Commonly known as: Ortho Micronor Take 1 tablet (0.35 mg total) by mouth daily.   PrePLUS 27-1 MG Tabs Take 1 tablet by mouth daily.         Discharge home in stable condition Infant Feeding: Bottle and Breast Infant Disposition:home with mother Discharge instruction: per After Visit Summary and Postpartum booklet. Activity: Advance as tolerated. Pelvic rest for 6 weeks.  Diet: routine diet Future Appointments:No future appointments. Follow up Visit:  Message sent to [facility] by Dr Higinio Plan:  Please schedule this patient for a In person postpartum visit in 6 weeks with the following provider: Any provider. Additional Postpartum F/U:2 hour GTT  High risk pregnancy complicated by: GDM Delivery mode:  Vaginal, Spontaneous  Anticipated Birth Control:  POPs   Mallie Snooks, [place], MSN, CNM Certified Nurse Midwife, Product/process development scientist for [employer], [place]

## 2021-08-19 NOTE — Progress Notes (Signed)
Labor Progress Note Tanja Gift is a 25 y.o. G4P1021 at [redacted]w[redacted]d presented for IOL 2/2 to A1GDM  S: Feeling contractions and more pressure. Has received 1 dose of fentanyl. Partner at bedside.    O:  BP 119/63   Pulse 82   Temp 98.3 F (36.8 C) (Oral)   Resp 16   Ht 5\' 2"  (1.575 m)   Wt 90.2 kg   LMP 11/19/2020 (Exact Date)   BMI 36.38 kg/m  EFM: 130/moderate/+ accels, no decels.   CVE: Dilation: 4 Effacement (%): 50 Cervical Position: Posterior Station: -2 Presentation: Vertex Exam by:: Dr. [email or web address]   A&P: 25 y.o. 30 [redacted]w[redacted]d IOL 2/2 to A1GDM #Labor: Progressing well. FB out. Contracting every 2 min. If CTX pattern spaces out, will redose cytotec for more cervical ripening.  #Pain: IV pain meds, Epidural on request #FWB: cat 1 #GBS negative  A1GDM: CBGs q4h for now, then q2h in active labor. Treat if >120.  [redacted]w[redacted]d, MD PGY-2 6:44 AM

## 2021-08-19 NOTE — Progress Notes (Signed)
Late entry for 0825  At bedside due to prolonged decel.  Pt on left lateral s/p epidural.  She reports no acute complaints.  Fetal heart rate noted to 80bpm.   SVE: 5/60/-2, FSE placed.  Terbutaline given.  While deceleration was occurring discussed with patient possible C-section if fetal heart rate did not return to baseline.  Pt repositioned to other side.  FHT returned to 140 with moderate variability.  25yo X8P3825 @ 104w0d for IOL due to GDMA1 -FHT now reassuring- Cat. I -will continue with expectant management at this time -GBS neg -GDMA1- initial accucheck 97  Myna Hidalgo, DO Attending Obstetrician & Gynecologist, [facility] for [employer], [facility]

## 2021-08-20 LAB — RH IG WORKUP (INCLUDES ABO/RH)
Fetal Screen: NEGATIVE
Gestational Age(Wks): 39
Unit division: 0

## 2021-08-20 LAB — GLUCOSE, CAPILLARY: Glucose-Capillary: 79 mg/dL (ref 70–99)

## 2021-08-20 MED ORDER — IBUPROFEN 600 MG PO TABS: 600.0000 mg | ORAL_TABLET | Freq: Four times a day (QID) | ORAL | 0 refills | Status: DC

## 2021-08-20 MED ORDER — ACETAMINOPHEN 325 MG PO TABS: 650.0000 mg | ORAL_TABLET | ORAL | 0 refills | Status: DC | PRN

## 2021-08-20 MED ORDER — NORETHINDRONE 0.35 MG PO TABS: 1.0000 | ORAL_TABLET | Freq: Every day | ORAL | 11 refills | Status: DC

## 2021-08-20 NOTE — Discharge Instructions (Signed)

## 2021-08-20 NOTE — Anesthesia Postprocedure Evaluation (Signed)
Anesthesia Post Note  Patient: Crystal May  Procedure(s) Performed: AN AD HOC LABOR EPIDURAL     Anesthesia Post Evaluation No notable events documented.  Last Vitals:  Vitals:   08/19/21 2330 08/20/21 0320  BP: 104/62 119/72  Pulse: 88 81  Resp: 20 18  Temp: 36.8 C 36.4 C  SpO2: 100% 100%    Last Pain:  Vitals:   08/20/21 0747  TempSrc:   PainSc: 6    Pain Goal: Patients Stated Pain Goal: 3 (08/19/21 1820)                 Lucinda Dell

## 2021-09-01 ENCOUNTER — Telehealth (HOSPITAL_COMMUNITY): Payer: Self-pay

## 2021-09-01 NOTE — Telephone Encounter (Signed)
No answer. Left message to return nurse call.  Marcelino Duster [facility] 09/01/2021,1320

## 2021-09-28 ENCOUNTER — Other Ambulatory Visit: Payer: Self-pay

## 2021-09-28 DIAGNOSIS — O24410 Gestational diabetes mellitus in pregnancy, diet controlled: Secondary | ICD-10-CM

## 2021-09-30 ENCOUNTER — Encounter: Payer: Self-pay | Admitting: Obstetrics and Gynecology

## 2021-09-30 ENCOUNTER — Other Ambulatory Visit: Payer: Self-pay

## 2021-09-30 ENCOUNTER — Other Ambulatory Visit: Payer: Medicaid Other

## 2021-09-30 ENCOUNTER — Ambulatory Visit (INDEPENDENT_AMBULATORY_CARE_PROVIDER_SITE_OTHER): Payer: Medicaid Other | Admitting: Obstetrics and Gynecology

## 2021-09-30 ENCOUNTER — Encounter: Payer: Self-pay | Admitting: Family Medicine

## 2021-09-30 VITALS — BP 121/90 | HR 69 | Wt 180.8 lb

## 2021-09-30 DIAGNOSIS — O24410 Gestational diabetes mellitus in pregnancy, diet controlled: Secondary | ICD-10-CM | POA: Diagnosis not present

## 2021-09-30 NOTE — Progress Notes (Signed)
Post Partum Visit Note  Crystal May is a 26 y.o. [phone] female who presents for a postpartum visit. She is 6 weeks postpartum following a normal spontaneous vaginal delivery.  I have fully reviewed the prenatal and intrapartum course. The delivery was at 39 gestational weeks.  Anesthesia: epidural. Postpartum course has been. Baby is doing well. Baby is feeding by bottle - Enfamil . Bleeding no bleeding. Bowel function is normal. Bladder function is normal. Patient is sexually active. Contraception method is oral progesterone-only contraceptive. Postpartum depression screening: negative.   The pregnancy intention screening data noted above was reviewed. Potential methods of contraception were discussed. The patient elected to proceed with No data recorded.   Edinburgh Postnatal Depression Scale - 09/30/21 0827       Edinburgh Postnatal Depression Scale:  In the Past 7 Days   I have been able to laugh and see the funny side of things. 0    I have looked forward with enjoyment to things. 0    I have blamed myself unnecessarily when things went wrong. 0    I have been anxious or worried for no good reason. 0    I have felt scared or panicky for no good reason. 0    Things have been getting on top of me. 0    I have been so unhappy that I have had difficulty sleeping. 0    I have felt sad or miserable. 0    I have been so unhappy that I have been crying. 0    The thought of harming myself has occurred to me. 0    Edinburgh Postnatal Depression Scale Total 0             Health Maintenance Due  Topic Date Due   HPV VACCINES (1 - 2-dose series) Never done    The following portions of the patient's history were reviewed and updated as appropriate: allergies, current medications, past family history, past medical history, past social history, past surgical history, and problem list.  Review of Systems Pertinent items are noted in HPI.  Objective:  BP 121/90    Pulse 69    Wt  180 lb 12.8 oz (82 kg)    Breastfeeding No    BMI 33.07 kg/m    General:  alert, cooperative, appears stated age, and no distress   Breasts:  not indicated  Lungs: clear to auscultation bilaterally  Heart:  regular rate and rhythm  Abdomen: soft, non-tender; bowel sounds normal; no masses,  no organomegaly   Wound N/a  GU exam:  not indicated       Assessment:    1. Gestational diabetes mellitus, class A1 2 hr test today  2. Postpartum care and examination   6w postpartum exam.   Plan:   Essential components of care per ACOG recommendations:  1.  Mood and well being: Patient with negative depression screening today. Reviewed local resources for support.  - Patient tobacco use? No.   - hx of drug use? No.    2. Infant care and feeding:  -Patient currently breastmilk feeding? No.  -Social determinants of health (SDOH) reviewed in EPIC. No concerns  3. Sexuality, contraception and birth spacing - Patient does not want a pregnancy in the next year.  Desired family size is unknown.  - Reviewed forms of contraception in tiered fashion. Patient desired oral progesterone-only contraceptive today.  She has not yet filled it. Has enough until next annual appointment per my review  of her chart - Discussed birth spacing of 18 months  4. Sleep and fatigue -Encouraged family/partner/community support of 4 hrs of uninterrupted sleep to help with mood and fatigue  5. Physical Recovery  - Discussed patients delivery and complications. She describes her labor as good. - Patient had a Vaginal, no problems at delivery. Patient had  no  laceration. Perineal healing reviewed. Patient expressed understanding - Patient has urinary incontinence? No. - Patient is safe to resume physical and sexual activity  6.  Health Maintenance - HM due items addressed Yes - Last pap smear  Diagnosis  Date Value Ref Range Status  02/28/2021   Final   - Negative for intraepithelial lesion or malignancy  (NILM)   Pap smear not done at today's visit.  -Breast Cancer screening indicated? No.   7. Chronic Disease/Pregnancy Condition follow up: Gestational Diabetes  - PCP follow up  Milas Hock, MD [facility]

## 2021-10-01 LAB — GLUCOSE TOLERANCE, 2 HOURS
Glucose, 2 hour: 89 mg/dL (ref 70–139)
Glucose, GTT - Fasting: 87 mg/dL (ref 70–99)

## 2021-12-31 ENCOUNTER — Other Ambulatory Visit: Payer: Self-pay

## 2021-12-31 ENCOUNTER — Encounter (HOSPITAL_COMMUNITY): Payer: Self-pay

## 2021-12-31 ENCOUNTER — Emergency Department (HOSPITAL_COMMUNITY)
Admission: EM | Admit: 2021-12-31 | Discharge: 2022-01-01 | Disposition: A | Discharge status: Home / Self Care | Payer: Medicaid Other | Attending: Emergency Medicine | Admitting: Emergency Medicine

## 2021-12-31 DIAGNOSIS — X58XXXA Exposure to other specified factors, initial encounter: Secondary | ICD-10-CM | POA: Diagnosis not present

## 2021-12-31 DIAGNOSIS — T192XXA Foreign body in vulva and vagina, initial encounter: Secondary | ICD-10-CM | POA: Diagnosis not present

## 2021-12-31 NOTE — ED Triage Notes (Signed)
Pt reports she was having intercourse and was using a toy and thinks the toy I still in her vagina. Pt reports it happen about 1-2 hours before her arrival. ?

## 2021-12-31 NOTE — ED Provider Notes (Signed)
?[facility] ?Provider Note ? ? ?CSN: [id number] ?Arrival date & time: 12/31/21  1843 ? ?  ? ?History ? ?Chief Complaint  ?Patient presents with  ? Foreign Body in Vagina  ? ? ?Crystal May is a 26 y.o. female who presents to the ED with complaints of vaginal foreign body that occurred shortly PTA. Patient reports she and her partner were having intercourse and thinks that the metal butt plug they were utilizing got stuck her vagina.  No alleviating/aggravating factors. Tried to remove @ home without success. Denies vaginal bleeding or abdominal pain.  ? ?HPI ? ?  ? ?Home Medications ?Prior to Admission medications   ?Medication Sig Start Date End Date Taking? Authorizing Provider  ?acetaminophen (TYLENOL) 325 MG tablet Take 2 tablets (650 mg total) by mouth every 4 (four) hours as needed for up to 180 doses (for pain scale < 4). ?Patient not taking: Reported on 09/30/2021 08/20/21   Darlina Rumpf, CNM  ?Blood Pressure Monitoring (BLOOD PRESSURE KIT) DEVI 1 Device by Does not apply route as needed. ?Patient not taking: Reported on 09/30/2021 02/28/21   Griffin Basil, MD  ?ibuprofen (ADVIL) 600 MG tablet Take 1 tablet (600 mg total) by mouth every 6 (six) hours. ?Patient not taking: Reported on 09/30/2021 08/20/21   Darlina Rumpf, CNM  ?norethindrone (ORTHO MICRONOR) 0.35 MG tablet Take 1 tablet (0.35 mg total) by mouth daily. ?Patient not taking: Reported on 09/30/2021 08/20/21   Darlina Rumpf, CNM  ?Prenatal Vit-Fe Fumarate-FA (PREPLUS) 27-1 MG TABS Take 1 tablet by mouth daily. ?Patient not taking: Reported on 09/30/2021 02/01/21   Scot Jun, FNP  ?   ? ?Allergies    ?Patient has no known allergies.   ? ?Review of Systems   ?Review of Systems  ?Constitutional:  Negative for chills and fever.  ?Gastrointestinal:  Negative for abdominal pain and vomiting.  ?Genitourinary:  Negative for vaginal bleeding.  ?     Positive for vaginal FB.   ?All other  systems reviewed and are negative. ? ?Physical Exam ?Updated Vital Signs ?BP 130/88 (BP Location: Right Arm)   Pulse 70   Temp 98.3 ?F (36.8 ?C) (Oral)   Resp 18   SpO2 97%  ?Physical Exam ?Vitals and nursing note reviewed. Exam conducted with a chaperone present.  ?Constitutional:   ?   General: She is not in acute distress. ?   Appearance: She is well-developed.  ?HENT:  ?   Head: Normocephalic and atraumatic.  ?Eyes:  ?   General:     ?   Right eye: No discharge.     ?   Left eye: No discharge.  ?   Conjunctiva/sclera: Conjunctivae normal.  ?Pulmonary:  ?   Effort: No respiratory distress.  ?Abdominal:  ?   General: There is no distension.  ?   Palpations: Abdomen is soft.  ?   Tenderness: There is no abdominal tenderness. There is no guarding or rebound.  ?Genitourinary: ?   Comments: Speculum exam: [place] discharge present. Metal foreign body noted inferior to the cervix. No active bleeding. No large lacerations.  ?Neurological:  ?   Mental Status: She is alert.  ?   Comments: Clear speech.   ?Psychiatric:     ?   Behavior: Behavior normal.     ?   Thought Content: Thought content normal.  ? ? ?ED Results / Procedures / Treatments   ?Labs ?(all labs ordered are listed, but only  abnormal results are displayed) ?Labs Reviewed - No data to display ? ?EKG ?None ? ?Radiology ?No results found. ? ?Procedures ?Marland KitchenForeign Body Removal ? ?Date/Time: 12/31/2021 11:24 PM ?Performed by: Amaryllis Dyke, PA-C ?Authorized by: Amaryllis Dyke, PA-C  ?Consent: Verbal consent obtained. ?Risks and benefits: risks, benefits and alternatives were discussed ?Consent given by: patient ?Body area: vagina ? ?Sedation: ?Patient sedated: no ? ?Patient restrained: no ?Patient cooperative: yes ?Localization method: visualized ?Removal mechanism: ring forceps ?1 objects recovered. ?Post-procedure assessment: foreign body removed ?Patient tolerance: patient tolerated the procedure well with no immediate complications ?Comments:  FB removal attempt x 2, ultimately able to remove w/ attending Dr. Betsey Holiday ?  ? ? ?Medications Ordered in ED ?Medications - No data to display ? ?ED Course/ Medical Decision Making/ A&P ?  ?                        ?Medical Decision Making ? ?Patient presents to the ED with complaints of vaginal FB. ?Nontoxic, vitals WNL.  ?Chart/nursing notes reviewed.  ?FB visualized- ultimately removed with attending utilizing ring forceps. Patient observed in the ED following removal, repeat speculum exam without bleeding or signs of injury. Appears appropriate for discharge.  ? ?I discussed treatment plan, need for follow-up, and return precautions with the patient. Provided opportunity for questions, patient confirmed understanding and is in agreement with plan.  ? ? ?Final Clinical Impression(s) / ED Diagnoses ?Final diagnoses:  ?Foreign body in vagina, initial encounter  ? ? ?Rx / DC Orders ?ED Discharge Orders   ? ? None  ? ?  ? ? ?  ?Amaryllis Dyke, PA-C ?01/01/22 0142 ? ?  ?Orpah Greek, MD ?01/01/22 2301 ? ?

## 2022-01-01 NOTE — Discharge Instructions (Signed)
Please follow up with obgyn and or primary care. Return to the ER for new or worsening symptoms including but not limited to new or worsening pain, vaginal bleeding, fever, or any other concerns.  ?
